# Patient Record
Sex: Male | Born: 1990 | Race: Black or African American | Hispanic: No | Smoking: Current every day smoker
Health system: Southern US, Community
[De-identification: ages and names within clinical notes are randomized; demographics above are authoritative.]

---
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
## 2017-02-23 ENCOUNTER — Ambulatory Visit (INDEPENDENT_AMBULATORY_CARE_PROVIDER_SITE_OTHER): Payer: Worker's Compensation

## 2017-02-23 ENCOUNTER — Encounter (HOSPITAL_COMMUNITY): Payer: Self-pay | Admitting: *Deleted

## 2017-02-23 ENCOUNTER — Ambulatory Visit (HOSPITAL_COMMUNITY)
Admission: EM | Admit: 2017-02-23 | Discharge: 2017-02-23 | Disposition: A | Discharge status: Home / Self Care | Payer: Worker's Compensation | Attending: Internal Medicine | Admitting: Internal Medicine

## 2017-02-23 DIAGNOSIS — M545 Low back pain, unspecified: Secondary | ICD-10-CM

## 2017-02-23 DIAGNOSIS — S1093XA Contusion of unspecified part of neck, initial encounter: Secondary | ICD-10-CM | POA: Diagnosis not present

## 2017-02-23 MED ORDER — NAPROXEN 500 MG PO TABS: 500.0000 mg | ORAL_TABLET | Freq: Two times a day (BID) | ORAL | 0 refills | Status: DC

## 2017-02-23 MED ORDER — KETOROLAC TROMETHAMINE 60 MG/2ML IM SOLN
60.0000 mg | Freq: Once | INTRAMUSCULAR | Status: AC
  Administered 2017-02-23: 60 mg via INTRAMUSCULAR

## 2017-02-23 MED ORDER — KETOROLAC TROMETHAMINE 60 MG/2ML IM SOLN
INTRAMUSCULAR | Status: AC
  Filled 2017-02-23: qty 2

## 2017-02-23 NOTE — ED Provider Notes (Signed)
[facility]    CSN: [id number] Arrival date & time: 02/23/17  1233     History   Chief Complaint Chief Complaint  Patient presents with  . Back Pain    HPI Grant Sandoval is a 26 y.o. male. 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  The shelf was full of boxes of packets of sugar, cups.  He was not knocked down. Did not hit his head, no loss of consciousness. Was able to get up unassisted. Walked into the urgent care independently. Discomfort in the left base of the neck/shoulder area, and in the right low back.    HPI  History reviewed. No pertinent past medical history.  History reviewed. No pertinent surgical history.     Home Medications    Prior to Admission medications   Medication Sig Start Date End Date Taking? Authorizing Provider  naproxen (NAPROSYN) 500 MG tablet Take 1 tablet (500 mg total) by mouth 2 (two) times daily. 02/23/17   Eustace Moore, MD    Family History History reviewed. No pertinent family history.  Social History Social History  Substance Use Topics  . Smoking status: Not on file  . Smokeless tobacco: Not on file  . Alcohol use No     Allergies   Patient has no known allergies.   Review of Systems Review of Systems  All other systems reviewed and are negative.    Physical Exam Triage Vital Signs ED Triage Vitals  Enc Vitals Group     BP 02/23/17 1346 122/70     Pulse Rate 02/23/17 1346 78     Resp 02/23/17 1346 18     Temp 02/23/17 1346 98.6 F (37 C)     Temp Source 02/23/17 1346 Oral     SpO2 02/23/17 1346 99 %     Weight --      Height --      Pain Score 02/23/17 1515 5     Pain Loc --    Updated Vital Signs BP 122/70 (BP Location: Right Arm)   Pulse 78   Temp 98.6 F (37 C) (Oral)   Resp 18   SpO2 99%   Physical Exam  Constitutional: He is oriented to person, place, and time. No distress.    Alert, nicely groomed  HENT:  Head: Atraumatic.  Eyes:  Conjugate gaze, no eye redness/drainage  Neck: Neck supple.  Cardiovascular: Normal rate.   Pulmonary/Chest: No respiratory distress.  Abdominal: He exhibits no distension.  Musculoskeletal: Normal range of motion.  Symmetric range of motion at both shoulders, able to extend fully overhead, internally and externally rotate. Little bit of discomfort with turning head, but range of motion is full including extension and flexion. No focal bony tenderness to percussion in the midline posteriorly. 3 inch puffy faintly red area at the left base of the neck laterally. Skin is intact. No midline posterior spinal percussion tenderness Mild right paralumbar spasm palpable Able to climb on/off exam table without difficulty, ambulate in facility  Neurological: He is alert and oriented to person, place, and time.  Skin: Skin is warm and dry.  No cyanosis  Nursing note and vitals reviewed.    UC Treatments / Results   Radiology Dg Cervical Spine Complete  Result Date: 02/23/2017 CLINICAL DATA:  Neck pain after shelf fell onto patient. EXAM: CERVICAL SPINE - COMPLETE 4+ VIEW COMPARISON:  None. FINDINGS: On the lateral view the cervical spine is visualized to the level of C7-T1. Straightening of the cervical spine. Pre-vertebral soft tissues are within normal limits. No fracture is detected in the cervical spine. Dens is well positioned between the lateral masses of C1. Cervical disc heights are preserved, with no appreciable spondylosis. No cervical spine subluxation. No significant facet arthropathy. No appreciable foraminal stenosis. No aggressive-appearing focal osseous lesions. There is soft tissue swelling in the posterior lower neck . IMPRESSION: 1. No cervical spine fracture or subluxation. 2. Straightening of the cervical spine, usually due to positioning and/or muscle spasm. 3. Soft tissue swelling in the posterior lower neck.  Electronically Signed   By: Delbert PhenixJason A Poff M.D.   On: 02/23/2017 14:48    Procedures Procedures (including critical care time)  Medications Ordered in UC Medications  ketorolac (TORADOL) injection 60 mg (60 mg Intramuscular Given 02/23/17 1441)   Neck Xrays were negative for bony injury/fracture at the urgent care today.  Injection of ketorolac (anti inflammatory/pain reliever) was given at the urgent care today.  Anticipate gradual improvement in pain and stiffness over the next several days.  Prescription for naproxen (anti inflammatory, pain reliever) [facility].  Ice for 5-10 minutes several times daily should help with pain and stiffness also.  Can return to duty but should avoid lifting >10 lbs for the next 3 days.  Followup with [facility] if not improving as expected.     Final Clinical Impressions(s) / UC Diagnoses   Final diagnoses:  Contusion of neck, initial encounter  Acute right-sided low back pain without sciatica    New Prescriptions Discharge Medication List as of 02/23/2017  3:10 PM    START taking these medications   Details  naproxen (NAPROSYN) 500 MG tablet Take 1 tablet (500 mg total) by mouth 2 (two) times daily., Starting Sun 02/23/2017, Normal         Eustace MooreMurray, Laura W, MD 02/26/17 2145

## 2017-02-23 NOTE — ED Triage Notes (Signed)
This   Is   A   workmans comp  Injury     He  Reports  A  Shelf  Fell  On  His  Upper  Back     Today  supv   Told     Pt  To  Come  To  The ucc      To  Be   Seen

## 2017-02-23 NOTE — Discharge Instructions (Addendum)
Neck Xrays were negative for bony injury/fracture at the urgent care today.  Injection of ketorolac (anti inflammatory/pain reliever) was given at the urgent care today.  Anticipate gradual improvement in pain and stiffness over the next several days.  Prescription for naproxen (anti inflammatory, pain reliever) [facility] [place].  Ice for 5-10 minutes several times daily should help with pain and stiffness also.  Can return to duty but should avoid lifting >10 lbs for the next 3 days.  Followup with [facility] if not improving as expected.

## 2017-03-28 ENCOUNTER — Ambulatory Visit (HOSPITAL_COMMUNITY)
Admission: EM | Admit: 2017-03-28 | Discharge: 2017-03-28 | Disposition: A | Discharge status: Home / Self Care | Payer: Self-pay | Attending: Physician Assistant | Admitting: Physician Assistant

## 2017-03-28 ENCOUNTER — Encounter (HOSPITAL_COMMUNITY): Payer: Self-pay | Admitting: *Deleted

## 2017-03-28 DIAGNOSIS — S39012A Strain of muscle, fascia and tendon of lower back, initial encounter: Secondary | ICD-10-CM

## 2017-03-28 DIAGNOSIS — G44319 Acute post-traumatic headache, not intractable: Secondary | ICD-10-CM

## 2017-03-28 MED ORDER — NAPROXEN 500 MG PO TABS: 500.0000 mg | ORAL_TABLET | Freq: Two times a day (BID) | ORAL | 0 refills | Status: DC

## 2017-03-28 MED ORDER — CYCLOBENZAPRINE HCL 10 MG PO TABS: 10.0000 mg | ORAL_TABLET | Freq: Two times a day (BID) | ORAL | 0 refills | Status: DC | PRN

## 2017-03-28 NOTE — ED Notes (Signed)
Bed: UC07 Expected date:  Expected time:  Means of arrival:  Comments: Closed 
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
## 2017-03-28 NOTE — Discharge Instructions (Signed)
Start Naproxen 500mg  twice a day for the next 7-10 days. It will help with the pain and inflammation of your muscles. I have prescribed you a muscle relaxant for muscle spasms/cramps. It will make you drowsy so do not take if you plan to operate heavy machinery or make big decisions.
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
## 2017-07-01 ENCOUNTER — Emergency Department (HOSPITAL_COMMUNITY)
Admission: EM | Admit: 2017-07-01 | Discharge: 2017-07-01 | Disposition: A | Discharge status: Home / Self Care | Payer: Self-pay | Attending: Emergency Medicine | Admitting: Emergency Medicine

## 2017-07-01 ENCOUNTER — Encounter (HOSPITAL_COMMUNITY): Payer: Self-pay | Admitting: *Deleted

## 2017-07-01 DIAGNOSIS — K0889 Other specified disorders of teeth and supporting structures: Secondary | ICD-10-CM | POA: Insufficient documentation

## 2017-07-01 DIAGNOSIS — Z79899 Other long term (current) drug therapy: Secondary | ICD-10-CM | POA: Insufficient documentation

## 2017-07-01 DIAGNOSIS — F17210 Nicotine dependence, cigarettes, uncomplicated: Secondary | ICD-10-CM | POA: Insufficient documentation

## 2017-07-01 MED ORDER — PENICILLIN V POTASSIUM 500 MG PO TABS: 500.0000 mg | ORAL_TABLET | Freq: Four times a day (QID) | ORAL | 0 refills | Status: AC

## 2017-07-01 MED ORDER — OXYCODONE-ACETAMINOPHEN 5-325 MG PO TABS
1.0000 | ORAL_TABLET | Freq: Once | ORAL | Status: AC
  Administered 2017-07-01: 1 via ORAL
  Filled 2017-07-01: qty 1

## 2017-07-01 MED ORDER — PENICILLIN V POTASSIUM 250 MG PO TABS
500.0000 mg | ORAL_TABLET | Freq: Once | ORAL | Status: AC
  Administered 2017-07-01: 500 mg via ORAL
  Filled 2017-07-01: qty 2

## 2017-07-01 MED ORDER — NAPROXEN 250 MG PO TABS
500.0000 mg | ORAL_TABLET | Freq: Once | ORAL | Status: AC
  Administered 2017-07-01: 500 mg via ORAL
  Filled 2017-07-01: qty 2

## 2017-07-01 MED ORDER — NAPROXEN 500 MG PO TABS: 500.0000 mg | ORAL_TABLET | Freq: Two times a day (BID) | ORAL | 0 refills | Status: DC

## 2017-07-01 NOTE — ED Notes (Signed)
[facility] pt, tooth pain, see provider's note

## 2017-07-01 NOTE — ED Provider Notes (Signed)
[facility]-EMERGENCY DEPT Provider Note   CSN: [id number] Arrival date & time: 07/01/17  0028    History   Chief Complaint Chief Complaint  Patient presents with  . Dental Pain    HPI Grant Sandoval is a 26 y.o. male.  26 year old male presents to the emergency department for constant, waxing and waning, throbbing pain to his right lower dentition which began this evening. No medications taken prior to arrival. Patient denies associated trauma or recent fevers. He denies being followed by a dentist.   The history is provided by the patient. No language interpreter was used.  Dental Pain   This is a new problem. The current episode started 3 to 5 hours ago. The problem occurs constantly. The problem has not changed since onset.The pain is mild. He has tried nothing for the symptoms. The treatment provided no relief.    History reviewed. No pertinent past medical history.  There are no active problems to display for this patient.   History reviewed. No pertinent surgical history.    Home Medications    Prior to Admission medications   Medication Sig Start Date End Date Taking? Authorizing Provider  cyclobenzaprine (FLEXERIL) 10 MG tablet Take 1 tablet (10 mg total) by mouth 2 (two) times daily as needed for muscle spasms. 03/28/17   Cathie Hoops, Amy V, PA-C  naproxen (NAPROSYN) 500 MG tablet Take 1 tablet (500 mg total) by mouth 2 (two) times daily. 07/01/17   Antony Madura, PA-C  penicillin v potassium (VEETID) 500 MG tablet Take 1 tablet (500 mg total) by mouth 4 (four) times daily. 07/01/17 07/08/17  Antony Madura, PA-C    Family History No family history on file.  Social History Social History  Substance Use Topics  . Smoking status: Current Some Day Smoker  . Smokeless tobacco: Never Used  . Alcohol use No     Allergies   Patient has no known allergies.   Review of Systems Review of Systems Ten systems reviewed and are negative for acute change, except as noted in the  HPI.    Physical Exam Updated Vital Signs BP 124/76 (BP Location: Right Arm)   Pulse 75   Temp 98 F (36.7 C) (Oral)   Resp 16   Ht  (1.727 m)   Wt 88.9 kg (196 lb)   SpO2 99%   BMI 29.80 kg/m   Physical Exam  Constitutional: He is oriented to person, place, and time. He appears well-developed and well-nourished. No distress.  Nontoxic appearing and in NAD  HENT:  Head: Normocephalic and atraumatic.  Mouth/Throat: Oropharynx is clear and moist. No trismus in the jaw. Dental caries present. No dental abscesses.    No trismus. Patient tolerating secretions without difficulty. Soft oral floor.  Eyes: Conjunctivae and EOM are normal. No scleral icterus.  Neck: Normal range of motion.  No meningismus  Pulmonary/Chest: Effort normal. No respiratory distress.  Respirations even and unlabored  Musculoskeletal: Normal range of motion.  Neurological: He is alert and oriented to person, place, and time. He exhibits normal muscle tone. Coordination normal.  Skin: Skin is warm and dry. No rash noted. He is not diaphoretic. No erythema. No pallor.  Psychiatric: He has a normal mood and affect. His behavior is normal.  Nursing note and vitals reviewed.    ED Treatments / Results  Labs (all labs ordered are listed, but only abnormal results are displayed) Labs Reviewed - No data to display  EKG  EKG Interpretation None  Radiology No results found.  Procedures Procedures (including critical care time)  Medications Ordered in ED Medications  oxyCODONE-acetaminophen (PERCOCET/ROXICET) 5-325 MG per tablet 1 tablet (1 tablet Oral Given 07/01/17 0258)  naproxen (NAPROSYN) tablet 500 mg (500 mg Oral Given 07/01/17 0258)  penicillin v potassium (VEETID) tablet 500 mg (500 mg Oral Given 07/01/17 0258)     Initial Impression / Assessment and Plan / ED Course  I have reviewed the triage vital signs and the nursing notes.  Pertinent labs & imaging results that were  available during my care of the patient were reviewed by me and considered in my medical decision making (see chart for details).     Patient with toothache. No gross abscess. Exam unconcerning for Ludwig's angina or spread of infection. Will treat with penicillin and pain medicine.  Urged patient to follow-up with dentist. Referral and resource guide provided. Patient discharged in stable condition with no unaddressed concerns.   Final Clinical Impressions(s) / ED Diagnoses   Final diagnoses:  Pain, dental    New Prescriptions New Prescriptions   PENICILLIN V POTASSIUM (VEETID) 500 MG TABLET    Take 1 tablet (500 mg total) by mouth 4 (four) times daily.     Antony Madura, PA-C 07/01/17 9604    Zadie Rhine, MD 07/01/17 [phone]

## 2017-07-01 NOTE — ED Triage Notes (Signed)
Pt c/o dental pain to R lower mouth for a month, pain woke him from sleep tonight

## 2018-03-23 ENCOUNTER — Emergency Department (HOSPITAL_COMMUNITY): Payer: Self-pay

## 2018-03-23 ENCOUNTER — Emergency Department (HOSPITAL_COMMUNITY)
Admission: EM | Admit: 2018-03-23 | Discharge: 2018-03-23 | Disposition: A | Discharge status: Home / Self Care | Payer: Self-pay | Attending: Emergency Medicine | Admitting: Emergency Medicine

## 2018-03-23 ENCOUNTER — Encounter (HOSPITAL_COMMUNITY): Payer: Self-pay | Admitting: Emergency Medicine

## 2018-03-23 ENCOUNTER — Other Ambulatory Visit: Payer: Self-pay

## 2018-03-23 DIAGNOSIS — F17200 Nicotine dependence, unspecified, uncomplicated: Secondary | ICD-10-CM | POA: Insufficient documentation

## 2018-03-23 DIAGNOSIS — M542 Cervicalgia: Secondary | ICD-10-CM | POA: Insufficient documentation

## 2018-03-23 DIAGNOSIS — R590 Localized enlarged lymph nodes: Secondary | ICD-10-CM | POA: Insufficient documentation

## 2018-03-23 DIAGNOSIS — J039 Acute tonsillitis, unspecified: Secondary | ICD-10-CM

## 2018-03-23 DIAGNOSIS — K029 Dental caries, unspecified: Secondary | ICD-10-CM

## 2018-03-23 LAB — CBC WITH DIFFERENTIAL/PLATELET
Abs Immature Granulocytes: 0 10*3/uL (ref 0.0–0.1)
BASOS PCT: 0 %
Basophils Absolute: 0 10*3/uL (ref 0.0–0.1)
Eosinophils Absolute: 0 10*3/uL (ref 0.0–0.7)
Eosinophils Relative: 0 %
HEMATOCRIT: 45 % (ref 39.0–52.0)
Hemoglobin: 15.6 g/dL (ref 13.0–17.0)
IMMATURE GRANULOCYTES: 0 %
LYMPHS ABS: 1.5 10*3/uL (ref 0.7–4.0)
Lymphocytes Relative: 20 %
MCH: 30.4 pg (ref 26.0–34.0)
MCHC: 34.7 g/dL (ref 30.0–36.0)
MCV: 87.7 fL (ref 78.0–100.0)
MONO ABS: 1.1 10*3/uL — AB (ref 0.1–1.0)
MONOS PCT: 14 %
NEUTROS PCT: 66 %
Neutro Abs: 5 10*3/uL (ref 1.7–7.7)
Platelets: 229 10*3/uL (ref 150–400)
RBC: 5.13 MIL/uL (ref 4.22–5.81)
RDW: 11.8 % (ref 11.5–15.5)
WBC: 7.7 10*3/uL (ref 4.0–10.5)

## 2018-03-23 LAB — URINALYSIS, ROUTINE W REFLEX MICROSCOPIC
BACTERIA UA: NONE SEEN
BILIRUBIN URINE: NEGATIVE
Glucose, UA: NEGATIVE mg/dL
Hgb urine dipstick: NEGATIVE
Ketones, ur: NEGATIVE mg/dL
LEUKOCYTES UA: NEGATIVE
Nitrite: NEGATIVE
PROTEIN: 30 mg/dL — AB
Specific Gravity, Urine: 1.035 — ABNORMAL HIGH (ref 1.005–1.030)
pH: 5 (ref 5.0–8.0)

## 2018-03-23 LAB — COMPREHENSIVE METABOLIC PANEL
ALBUMIN: 4.4 g/dL (ref 3.5–5.0)
ALT: 24 U/L (ref 17–63)
AST: 19 U/L (ref 15–41)
Alkaline Phosphatase: 50 U/L (ref 38–126)
Anion gap: 8 (ref 5–15)
BUN: 12 mg/dL (ref 6–20)
CALCIUM: 9.5 mg/dL (ref 8.9–10.3)
CHLORIDE: 104 mmol/L (ref 101–111)
CO2: 29 mmol/L (ref 22–32)
CREATININE: 1.19 mg/dL (ref 0.61–1.24)
GFR calc non Af Amer: >60 mL/min (ref >60)
GLUCOSE: 98 mg/dL (ref 65–99)
Potassium: 3.9 mmol/L (ref 3.5–5.1)
SODIUM: 141 mmol/L (ref 135–145)
Total Bilirubin: 1.7 mg/dL — ABNORMAL HIGH (ref 0.3–1.2)
Total Protein: 7.9 g/dL (ref 6.5–8.1)

## 2018-03-23 LAB — GROUP A STREP BY PCR: GROUP A STREP BY PCR: NOT DETECTED

## 2018-03-23 LAB — I-STAT CG4 LACTIC ACID, ED: Lactic Acid, Venous: 0.66 mmol/L (ref 0.5–1.9)

## 2018-03-23 MED ORDER — CLINDAMYCIN HCL 150 MG PO CAPS: 450.0000 mg | ORAL_CAPSULE | Freq: Three times a day (TID) | ORAL | 0 refills | Status: AC

## 2018-03-23 MED ORDER — CLINDAMYCIN PHOSPHATE 600 MG/50ML IV SOLN
600.0000 mg | Freq: Once | INTRAVENOUS | Status: AC
  Administered 2018-03-23: 600 mg via INTRAVENOUS
  Filled 2018-03-23: qty 50

## 2018-03-23 MED ORDER — ACETAMINOPHEN 325 MG PO TABS
650.0000 mg | ORAL_TABLET | Freq: Once | ORAL | Status: AC
  Administered 2018-03-23: 650 mg via ORAL
  Filled 2018-03-23: qty 2

## 2018-03-23 MED ORDER — DEXAMETHASONE SODIUM PHOSPHATE 10 MG/ML IJ SOLN
10.0000 mg | Freq: Once | INTRAMUSCULAR | Status: AC
  Administered 2018-03-23: 10 mg via INTRAVENOUS
  Filled 2018-03-23: qty 1

## 2018-03-23 MED ORDER — IOPAMIDOL (ISOVUE-300) INJECTION 61%
75.0000 mL | Freq: Once | INTRAVENOUS | Status: AC
  Administered 2018-03-23: 75 mL via INTRAVENOUS

## 2018-03-23 MED ORDER — SODIUM CHLORIDE 0.9 % IV BOLUS
1000.0000 mL | Freq: Once | INTRAVENOUS | Status: AC
  Administered 2018-03-23: 1000 mL via INTRAVENOUS

## 2018-03-23 NOTE — Discharge Instructions (Signed)
You were given a prescription for antibiotics. Please take the antibiotic prescription fully.   I have prescribed a new medication for you today. It is important that when you pick the prescription up you discuss the potential interactions of this medication with other medications you are taking, including over the counter medications, with the pharmacists.   This new medication has potential side effects. Be sure to contact your primary care provider or return to the emergency department if you are experiencing new symptoms that you are unable to tolerate after starting the medication. You need to receive medical evaluation immediately if you start to experience blistering of the skin, rash, swelling, or difficulty breathing as these signs could indicate a more serious medication side effect.   Please follow up with your primary care provider within 5-7 days for re-evaluation of your symptoms. If you do not have a primary care provider, information for a healthcare clinic has been provided for you to make arrangements for follow up care.  You were  also given resources to follow-up with a dentist. please return to the emergency department for any new or worsening symptoms.

## 2018-03-23 NOTE — ED Notes (Signed)
ED Provider at bedside. 

## 2018-03-23 NOTE — ED Triage Notes (Signed)
Pt. Stated, I hurt all over even my bones.

## 2018-03-23 NOTE — ED Notes (Signed)
Patient transported to CT 

## 2018-03-23 NOTE — ED Notes (Signed)
Pt stable, ambulatory, states understanding of discharge instructions 

## 2018-03-23 NOTE — ED Provider Notes (Signed)
[facility] EMERGENCY DEPARTMENT Provider Note   CSN: [id number] Arrival date & time: 03/23/18  0909     History   Chief Complaint Chief Complaint  Patient presents with  . Generalized Body Aches    HPI Grant Sandoval is a 27 y.o. male.  HPI   27 year old male with no stinging and past medical history who presents the emergency department today to be evaluated for sore throat which has been ongoing for the last 4 days.  States that 4 days ago began to have sore throat, chills, body aches.  Pain worse on the right side of his throat.  Has painful swallowing but has been able to tolerate liquids and solids at home.  No changes to his voice.  Pain is constant, worse with swallowing.  Rated as severe.  Did not take his temperature at home.  Denies headaches, lightheadedness, vision changes, cough, nasal congestion, rhinorrhea.  Does have some right-sided ear pain.  No chest pain or shortness of breath.  No abdominal pain, nausea, vomiting, diarrhea or urinary symptoms.  Denies being around anyone with similar symptoms.  Denies any dental pain.  History reviewed. No pertinent past medical history.  There are no active problems to display for this patient.   History reviewed. No pertinent surgical history.      Home Medications    Prior to Admission medications   Medication Sig Start Date End Date Taking? Authorizing Provider  clindamycin (CLEOCIN) 150 MG capsule Take 3 capsules (450 mg total) by mouth 3 (three) times daily for 10 days. 03/23/18 04/02/18  Deania Siguenza S, PA-C  cyclobenzaprine (FLEXERIL) 10 MG tablet Take 1 tablet (10 mg total) by mouth 2 (two) times daily as needed for muscle spasms. Patient not taking: Reported on 03/23/2018 03/28/17   Belinda Fisher, PA-C  naproxen (NAPROSYN) 500 MG tablet Take 1 tablet (500 mg total) by mouth 2 (two) times daily. Patient not taking: Reported on 03/23/2018 07/01/17   Antony Madura, PA-C    Family History No family  history on file.  Social History Social History   Tobacco Use  . Smoking status: Current Some Day Smoker  . Smokeless tobacco: Never Used  Substance Use Topics  . Alcohol use: No  . Drug use: No     Allergies   Shellfish allergy   Review of Systems Review of Systems  Constitutional: Positive for appetite change, chills and fatigue. Negative for fever.  HENT: Positive for congestion, ear pain and sore throat. Negative for dental problem, postnasal drip, rhinorrhea, trouble swallowing and voice change.   Eyes: Negative for pain and visual disturbance.  Respiratory: Negative for cough and shortness of breath.   Cardiovascular: Negative for chest pain.  Gastrointestinal: Negative for abdominal pain, constipation, diarrhea, nausea and vomiting.  Genitourinary: Negative for dysuria and hematuria.  Musculoskeletal: Negative for back pain.  Skin: Negative for rash.  Neurological: Negative for dizziness, weakness, light-headedness, numbness and headaches.  All other systems reviewed and are negative.  Physical Exam Updated Vital Signs BP 136/71 (BP Location: Right Arm)   Pulse 95   Temp 99.4 F (37.4 C) (Oral)   Resp 16   Ht 5\' 10"  (1.778 m)   Wt 85.7 kg (189 lb)   SpO2 98%   BMI 27.12 kg/m   Physical Exam  Constitutional: He is oriented to person, place, and time. He appears well-developed and well-nourished. No distress.  Nontoxic-appearing.  HENT:  Head: Normocephalic and atraumatic.  Right TM without effusion  or erythema.  Left TM serum and obstructed.  Pharyngeal erythema with edematous tonsils.  Right tonsil appears larger than left.  No tonsillar kissing.  Uvula not significantly deviated.  Patient tolerating secretions.  With patent airway.  No hot potato voice.  Eyes: Pupils are equal, round, and reactive to light. Conjunctivae and EOM are normal.  Neck: Normal range of motion. Neck supple.  Mild swelling to right side of neck along anterior cervical chain.    Cardiovascular: Normal rate, regular rhythm and normal heart sounds.  No murmur heard. Pulmonary/Chest: Effort normal and breath sounds normal. No stridor. No respiratory distress. He has no wheezes.  Abdominal: Soft. Bowel sounds are normal. He exhibits no distension. There is no tenderness. There is no guarding.  Musculoskeletal: Normal range of motion.  Lymphadenopathy:    He has cervical adenopathy.  Neurological: He is alert and oriented to person, place, and time. No cranial nerve deficit.  Skin: Skin is warm and dry.  Psychiatric: He has a normal mood and affect.  Nursing note and vitals reviewed.  ED Treatments / Results  Labs (all labs ordered are listed, but only abnormal results are displayed) Labs Reviewed  COMPREHENSIVE METABOLIC PANEL - Abnormal; Notable for the following components:      Result Value   Total Bilirubin 1.7 (*)    All other components within normal limits  CBC WITH DIFFERENTIAL/PLATELET - Abnormal; Notable for the following components:   Monocytes Absolute 1.1 (*)    All other components within normal limits  URINALYSIS, ROUTINE W REFLEX MICROSCOPIC - Abnormal; Notable for the following components:   Color, Urine AMBER (*)    Specific Gravity, Urine 1.035 (*)    Protein, ur 30 (*)    All other components within normal limits  GROUP A STREP BY PCR  I-STAT CG4 LACTIC ACID, ED    EKG None  Radiology Dg Chest 2 View  Result Date: 03/23/2018 CLINICAL DATA:  Fever since Friday ,,smoker EXAM: CHEST - 2 VIEW COMPARISON:  None. FINDINGS: The heart size and mediastinal contours are within normal limits. Both lungs are clear. The visualized skeletal structures are unremarkable. IMPRESSION: No active cardiopulmonary disease. No evidence of pneumonia or pulmonary edema. Electronically Signed   By: Bary RichardStan  Maynard M.D.   On: 03/23/2018 09:59   Ct Soft Tissue Neck W Contrast  Result Date: 03/23/2018 CLINICAL DATA:  27 year old male with fever and right side  neck pain for 3-4 days. Painful swallowing. EXAM: CT NECK WITH CONTRAST TECHNIQUE: Multidetector CT imaging of the neck was performed using the standard protocol following the bolus administration of intravenous contrast. CONTRAST:  75 milliliters ISOVUE-300 IOPAMIDOL (ISOVUE-300) INJECTION 61% COMPARISON:  Cervical spine radiographs 02/23/2017. FINDINGS: Pharynx and larynx: The larynx including the epiglottis appears normal. There is mildly asymmetric hypertrophy of hyperenhancing lingual tonsil (series 3, image 54), bulky or on the right. There is bilateral palatine tonsillar enlargement, again greater on the right. Hyperenhancement of the palatine tonsils is also noted and somewhat more conspicuous on the right side, however, there is no tonsillar or peritonsillar fluid collection identified. The parapharyngeal spaces appear symmetric and normal. Superimposed similar enlargement and mild hyperenhancement of the adenoids in the nasopharynx. The retropharyngeal space appears normal aside from reactive, mildly hyperenhancing retropharyngeal lymph nodes (series 3, image 33). No retropharyngeal fluid. Salivary glands: Sublingual space, sublingual glands, submandibular glands, and parotid glands appear normal. Thyroid: Negative. Lymph nodes: Enlarged and hyperenhancing bilateral level 2A lymph nodes, individually up to 19 millimeter short axis  on the right and 14 millimeters on the left. Smaller hyperenhancing level 3 lymph nodes, also more numerous on the right. The level 1 nodes appear spared. There is mild left level 5 lymph node prominence. The level 4 nodes appear normal. Vascular: Major vascular structures in the neck and at the skull base are patent including both internal jugular veins, although the IJ is are sometimes effaced by the reactive appearing lymph nodes. Limited intracranial: Negative. Visualized orbits: Negative. Mastoids and visualized paranasal sinuses: Clear. Hyperplastic (normal variant).  Skeleton: Carious right mandible molar (sagittal image 40). No other acute dental findings. No other osseous abnormality identified. Upper chest: Normal lung apices. Small volume residual thymus in the superior mediastinum, normal variant. No superior mediastinal lymphadenopathy. Visible axillary lymph nodes are normal. IMPRESSION: 1. Acute Tonsillitis with generalized enlarged and hyperenhancing palatine tonsils, adenoids, lingual tonsil. 2. Reactive retropharyngeal and bilateral level 2/3 lymphadenopathy. 3. Negative for abscess or complicating features. 4. Carious right mandible molar. Electronically Signed   By: Odessa Fleming M.D.   On: 03/23/2018 14:01    Procedures Procedures (including critical care time)  Medications Ordered in ED Medications  clindamycin (CLEOCIN) IVPB 600 mg (600 mg Intravenous New Bag/Given 03/23/18 1446)  sodium chloride 0.9 % bolus 1,000 mL (1,000 mLs Intravenous New Bag/Given 03/23/18 1446)  iopamidol (ISOVUE-300) 61 % injection 75 mL (75 mLs Intravenous Contrast Given 03/23/18 1348)  dexamethasone (DECADRON) injection 10 mg (10 mg Intravenous Given 03/23/18 1441)  acetaminophen (TYLENOL) tablet 650 mg (650 mg Oral Given 03/23/18 1440)     Initial Impression / Assessment and Plan / ED Course  I have reviewed the triage vital signs and the nursing notes.  Pertinent labs & imaging results that were available during my care of the patient were reviewed by me and considered in my medical decision making (see chart for details).  Discussed pt presentation and exam findings with Dr. Fredderick Phenix, who reccommended IV abx, decadron, rx for clinda as outpt and close output f/u.  Final Clinical Impressions(s) / ED Diagnoses   Final diagnoses:  Tonsillitis  Dental caries   27 year old male presenting with sore throat for 4 days with generalized body aches.  Temperature 100 Fahrenheit initially.  Borderline tachycardic, with normal blood pressure respirations and O2 sats.  All vital  signs normalized by the time of discharge.  Patient has pharyngeal erythema and tonsillar edema bilaterally that is worse on the right.  No significant uvular deviation.  He has a patent airway with a normal voice.  He is tolerating p.o. in the ED without difficulty.  His labs are without leukocytosis.  CMP is within normal limits without gross electrolyte abnormalities.  Group A strep was negative.  Lactic acid was negative.  UA negative.  Patient is nontoxic-appearing nonseptic appearing.  Chest x-ray negative for pneumonia or other abnormality.  CT soft tissue neck was completed and did not show any signs of peritonsillar retropharyngeal abscess.  Did show acute tonsillitis with generalized and large tonsils with retropharyngeal lymphadenopathy.  Dental caries also noted.  Hydrated with IV fluids in the ED as well as given IV Clinda and Decadron.  Will be DC'd with Rx for Clinda and advised to follow-up closely as an outpatient.  Strict return precautions were discussed for any concerning symptoms.  Patient also given dental resources given his dental caries on CT scan.  Advised to follow-up with dentist.  Patient voices understanding of the plan and reasons to return immediately to the ED.  All questions were  answered.  ED Discharge Orders        Ordered    clindamycin (CLEOCIN) 150 MG capsule  3 times daily     03/23/18 [address], Ellesse Antenucci S, PA-C 03/23/18 1513    Rolan Bucco, MD 03/23/18 1516

## 2018-12-26 ENCOUNTER — Emergency Department (HOSPITAL_COMMUNITY)
Admission: EM | Admit: 2018-12-26 | Discharge: 2018-12-26 | Disposition: A | Discharge status: Home / Self Care | Payer: Managed Care, Other (non HMO) | Attending: Emergency Medicine | Admitting: Emergency Medicine

## 2018-12-26 ENCOUNTER — Other Ambulatory Visit: Payer: Self-pay

## 2018-12-26 ENCOUNTER — Emergency Department (HOSPITAL_COMMUNITY): Payer: Managed Care, Other (non HMO)

## 2018-12-26 ENCOUNTER — Encounter (HOSPITAL_COMMUNITY): Payer: Self-pay | Admitting: Emergency Medicine

## 2018-12-26 DIAGNOSIS — S61411A Laceration without foreign body of right hand, initial encounter: Secondary | ICD-10-CM | POA: Diagnosis not present

## 2018-12-26 DIAGNOSIS — S3991XA Unspecified injury of abdomen, initial encounter: Secondary | ICD-10-CM | POA: Diagnosis present

## 2018-12-26 DIAGNOSIS — S0990XA Unspecified injury of head, initial encounter: Secondary | ICD-10-CM | POA: Insufficient documentation

## 2018-12-26 DIAGNOSIS — S52092A Other fracture of upper end of left ulna, initial encounter for closed fracture: Secondary | ICD-10-CM | POA: Diagnosis not present

## 2018-12-26 DIAGNOSIS — S52182A Other fracture of upper end of left radius, initial encounter for closed fracture: Secondary | ICD-10-CM | POA: Diagnosis not present

## 2018-12-26 DIAGNOSIS — W3400XA Accidental discharge from unspecified firearms or gun, initial encounter: Secondary | ICD-10-CM

## 2018-12-26 DIAGNOSIS — Y999 Unspecified external cause status: Secondary | ICD-10-CM | POA: Diagnosis not present

## 2018-12-26 DIAGNOSIS — S42402A Unspecified fracture of lower end of left humerus, initial encounter for closed fracture: Secondary | ICD-10-CM

## 2018-12-26 DIAGNOSIS — S298XXA Other specified injuries of thorax, initial encounter: Secondary | ICD-10-CM | POA: Insufficient documentation

## 2018-12-26 DIAGNOSIS — Y9389 Activity, other specified: Secondary | ICD-10-CM | POA: Diagnosis not present

## 2018-12-26 DIAGNOSIS — Y92008 Other place in unspecified non-institutional (private) residence as the place of occurrence of the external cause: Secondary | ICD-10-CM | POA: Insufficient documentation

## 2018-12-26 LAB — COMPREHENSIVE METABOLIC PANEL
ALT: 22 U/L (ref 0–44)
AST: 27 U/L (ref 15–41)
Albumin: 4.6 g/dL (ref 3.5–5.0)
Alkaline Phosphatase: 51 U/L (ref 38–126)
Anion gap: 16 — ABNORMAL HIGH (ref 5–15)
BUN: 12 mg/dL (ref 6–20)
CO2: 18 mmol/L — ABNORMAL LOW (ref 22–32)
Calcium: 9.6 mg/dL (ref 8.9–10.3)
Chloride: 106 mmol/L (ref 98–111)
Creatinine, Ser: 1.41 mg/dL — ABNORMAL HIGH (ref 0.61–1.24)
GFR calc non Af Amer: >60 mL/min (ref >60)
Glucose, Bld: 173 mg/dL — ABNORMAL HIGH (ref 70–99)
Potassium: 2.7 mmol/L — CL (ref 3.5–5.1)
Sodium: 140 mmol/L (ref 135–145)
TOTAL PROTEIN: 7.6 g/dL (ref 6.5–8.1)
Total Bilirubin: 2 mg/dL — ABNORMAL HIGH (ref 0.3–1.2)

## 2018-12-26 LAB — URINALYSIS, ROUTINE W REFLEX MICROSCOPIC
Bacteria, UA: NONE SEEN
Bilirubin Urine: NEGATIVE
Glucose, UA: NEGATIVE mg/dL
Ketones, ur: NEGATIVE mg/dL
Leukocytes,Ua: NEGATIVE
NITRITE: NEGATIVE
Protein, ur: NEGATIVE mg/dL
Specific Gravity, Urine: 1.043 — ABNORMAL HIGH (ref 1.005–1.030)
pH: 6 (ref 5.0–8.0)

## 2018-12-26 LAB — CBC
HCT: 47 % (ref 39.0–52.0)
HEMOGLOBIN: 15.8 g/dL (ref 13.0–17.0)
MCH: 29.9 pg (ref 26.0–34.0)
MCHC: 33.6 g/dL (ref 30.0–36.0)
MCV: 88.8 fL (ref 80.0–100.0)
NRBC: 0 % (ref 0.0–0.2)
Platelets: 331 10*3/uL (ref 150–400)
RBC: 5.29 MIL/uL (ref 4.22–5.81)
RDW: 11.9 % (ref 11.5–15.5)
WBC: 11 10*3/uL — ABNORMAL HIGH (ref 4.0–10.5)

## 2018-12-26 LAB — PROTIME-INR
INR: 1.1 (ref 0.8–1.2)
Prothrombin Time: 13.7 seconds (ref 11.4–15.2)

## 2018-12-26 LAB — BPAM RBC
Blood Product Expiration Date: 202004172359
Blood Product Expiration Date: 202004172359
ISSUE DATE / TIME: 202003141235
ISSUE DATE / TIME: 202003141235
Unit Type and Rh: 5100
Unit Type and Rh: 5100

## 2018-12-26 LAB — TYPE AND SCREEN
ABO/RH(D): B POS
Antibody Screen: NEGATIVE
Unit division: 0
Unit division: 0

## 2018-12-26 LAB — LACTIC ACID, PLASMA: Lactic Acid, Venous: 7.2 mmol/L (ref 0.5–1.9)

## 2018-12-26 LAB — ETHANOL: Alcohol, Ethyl (B): <10 mg/dL (ref <10)

## 2018-12-26 LAB — CDS SEROLOGY

## 2018-12-26 LAB — ABO/RH: ABO/RH(D): B POS

## 2018-12-26 MED ORDER — LIDOCAINE-EPINEPHRINE 2 %-1:100000 IJ SOLN
20.0000 mL | Freq: Once | INTRAMUSCULAR | Status: AC
  Administered 2018-12-26: 20 mL
  Filled 2018-12-26: qty 20

## 2018-12-26 MED ORDER — MORPHINE SULFATE 15 MG PO TABS: 15.0000 mg | ORAL_TABLET | ORAL | 0 refills | Status: DC | PRN

## 2018-12-26 MED ORDER — IOHEXOL 300 MG/ML  SOLN
100.0000 mL | Freq: Once | INTRAMUSCULAR | Status: AC | PRN
  Administered 2018-12-26: 100 mL via INTRAVENOUS

## 2018-12-26 MED ORDER — MORPHINE SULFATE (PF) 4 MG/ML IV SOLN
INTRAVENOUS | Status: AC
  Filled 2018-12-26: qty 1

## 2018-12-26 MED ORDER — MORPHINE SULFATE 2 MG/ML IJ SOLN
INTRAMUSCULAR | Status: AC | PRN
  Administered 2018-12-26: 4 mg via INTRAVENOUS

## 2018-12-26 MED ORDER — MORPHINE SULFATE (PF) 4 MG/ML IV SOLN
4.0000 mg | Freq: Once | INTRAVENOUS | Status: AC
  Administered 2018-12-26: 4 mg via INTRAVENOUS
  Filled 2018-12-26: qty 1

## 2018-12-26 MED ORDER — CEPHALEXIN 500 MG PO CAPS: 500.0000 mg | ORAL_CAPSULE | Freq: Four times a day (QID) | ORAL | 0 refills | Status: DC

## 2018-12-26 MED ORDER — CEFAZOLIN SODIUM-DEXTROSE 2-4 GM/100ML-% IV SOLN
2.0000 g | Freq: Once | INTRAVENOUS | Status: AC
  Administered 2018-12-26: 2 g via INTRAVENOUS
  Filled 2018-12-26: qty 100

## 2018-12-26 NOTE — ED Notes (Signed)
Dr Ranae Palms notified of pt's pain level 10/10, gave verbal order for 4mg  morphine IV
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
## 2018-12-26 NOTE — Progress Notes (Signed)
   12/26/18 1600  Clinical Encounter Type  Visited With Patient;Health care provider  Visit Type ED  Provided the ministry of presence 

## 2018-12-26 NOTE — Discharge Instructions (Addendum)
Return to [facility] at 8 AM on Monday morning to have surgery for your left elbow.  You should be able to check in at admissions like you are getting ready to go straight to an operation.  The surgeon should be Dr. Bradly Bienenstock.  He should not have anything to eat or drink after midnight the night before surgery.
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
## 2018-12-26 NOTE — ED Notes (Signed)
Pt and family verbalized understanding of d/c instructions. Provided urinal, extra 4x4 and tape.  Pt assisted to 6N elevators to go to floor to visit his mother. Pt verbalized understanding to be at admitting Monday morning for surgery.

## 2018-12-26 NOTE — Progress Notes (Signed)
Orthopedic Tech Progress Note Patient Details:  Grant Sandoval 29-Oct-1990 [id number]          [name] 12/26/2018, 3:54 PM
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
## 2018-12-27 ENCOUNTER — Telehealth: Payer: Self-pay | Admitting: Surgery

## 2018-12-27 LAB — BPAM FFP
BLOOD PRODUCT EXPIRATION DATE: 202003192359
ISSUE DATE / TIME: 202003141241
UNIT TYPE AND RH: 6200

## 2018-12-27 LAB — PREPARE FRESH FROZEN PLASMA: Unit division: 0

## 2018-12-27 NOTE — Telephone Encounter (Signed)
ED CM received call from patient concerning a, w/c for home use. Patient does not have insurance unable assist provided patient with some resources to obtain w/c in the community.

## 2018-12-28 ENCOUNTER — Other Ambulatory Visit: Payer: Self-pay

## 2018-12-28 ENCOUNTER — Inpatient Hospital Stay (HOSPITAL_COMMUNITY): Payer: Managed Care, Other (non HMO) | Admitting: Anesthesiology

## 2018-12-28 ENCOUNTER — Ambulatory Visit (HOSPITAL_COMMUNITY)
Admission: RE | Admit: 2018-12-28 | Discharge: 2018-12-29 | Disposition: A | Discharge status: Home / Self Care | Payer: Managed Care, Other (non HMO) | Source: Ambulatory Visit | Attending: Orthopedic Surgery | Admitting: Orthopedic Surgery

## 2018-12-28 ENCOUNTER — Encounter (HOSPITAL_COMMUNITY)
Admission: RE | Disposition: A | Discharge status: Home / Self Care | Payer: Self-pay | Source: Ambulatory Visit | Attending: Orthopedic Surgery

## 2018-12-28 ENCOUNTER — Encounter (HOSPITAL_COMMUNITY): Payer: Self-pay

## 2018-12-28 DIAGNOSIS — S42402B Unspecified fracture of lower end of left humerus, initial encounter for open fracture: Secondary | ICD-10-CM | POA: Insufficient documentation

## 2018-12-28 DIAGNOSIS — S52202A Unspecified fracture of shaft of left ulna, initial encounter for closed fracture: Secondary | ICD-10-CM | POA: Diagnosis present

## 2018-12-28 DIAGNOSIS — W3400XA Accidental discharge from unspecified firearms or gun, initial encounter: Secondary | ICD-10-CM | POA: Insufficient documentation

## 2018-12-28 DIAGNOSIS — S52042B Displaced fracture of coronoid process of left ulna, initial encounter for open fracture type I or II: Secondary | ICD-10-CM | POA: Diagnosis not present

## 2018-12-28 DIAGNOSIS — S91142A Puncture wound with foreign body of left great toe without damage to nail, initial encounter: Secondary | ICD-10-CM | POA: Insufficient documentation

## 2018-12-28 DIAGNOSIS — S52122B Displaced fracture of head of left radius, initial encounter for open fracture type I or II: Secondary | ICD-10-CM | POA: Diagnosis not present

## 2018-12-28 HISTORY — PX: ORIF ELBOW FRACTURE: SHX5031

## 2018-12-28 HISTORY — PX: FOREIGN BODY REMOVAL: SHX962

## 2018-12-28 LAB — BASIC METABOLIC PANEL
Anion gap: 8 (ref 5–15)
BUN: 9 mg/dL (ref 6–20)
CO2: 23 mmol/L (ref 22–32)
Calcium: 9.1 mg/dL (ref 8.9–10.3)
Chloride: 104 mmol/L (ref 98–111)
Creatinine, Ser: 0.99 mg/dL (ref 0.61–1.24)
GFR calc non Af Amer: >60 mL/min (ref >60)
Glucose, Bld: 123 mg/dL — ABNORMAL HIGH (ref 70–99)
Potassium: 3.7 mmol/L (ref 3.5–5.1)
Sodium: 135 mmol/L (ref 135–145)

## 2018-12-28 SURGERY — OPEN REDUCTION INTERNAL FIXATION (ORIF) ELBOW/OLECRANON FRACTURE
Anesthesia: Regional | Site: Toe | Laterality: Left

## 2018-12-28 MED ORDER — ADULT MULTIVITAMIN W/MINERALS CH
1.0000 | ORAL_TABLET | Freq: Every day | ORAL | Status: DC
  Administered 2018-12-29: 1 via ORAL
  Filled 2018-12-28: qty 1

## 2018-12-28 MED ORDER — ESMOLOL HCL 100 MG/10ML IV SOLN
INTRAVENOUS | Status: AC
  Filled 2018-12-28: qty 10

## 2018-12-28 MED ORDER — DEXMEDETOMIDINE HCL 200 MCG/2ML IV SOLN
INTRAVENOUS | Status: DC | PRN
  Administered 2018-12-28: 12 ug via INTRAVENOUS
  Administered 2018-12-28 (×2): 8 ug via INTRAVENOUS
  Administered 2018-12-28: 12 ug via INTRAVENOUS

## 2018-12-28 MED ORDER — FENTANYL CITRATE (PF) 100 MCG/2ML IJ SOLN
25.0000 ug | INTRAMUSCULAR | Status: DC | PRN
  Administered 2018-12-28: 50 ug via INTRAVENOUS
  Administered 2018-12-28: 25 ug via INTRAVENOUS

## 2018-12-28 MED ORDER — FENTANYL CITRATE (PF) 100 MCG/2ML IJ SOLN
100.0000 ug | Freq: Once | INTRAMUSCULAR | Status: AC
  Administered 2018-12-28: 100 ug via INTRAVENOUS
  Filled 2018-12-28: qty 2

## 2018-12-28 MED ORDER — METHOCARBAMOL 500 MG PO TABS
500.0000 mg | ORAL_TABLET | Freq: Four times a day (QID) | ORAL | Status: DC | PRN
  Administered 2018-12-28 – 2018-12-29 (×2): 500 mg via ORAL
  Filled 2018-12-28 (×2): qty 1

## 2018-12-28 MED ORDER — METHOCARBAMOL 1000 MG/10ML IJ SOLN
500.0000 mg | Freq: Four times a day (QID) | INTRAVENOUS | Status: DC | PRN
  Filled 2018-12-28: qty 5

## 2018-12-28 MED ORDER — FENTANYL CITRATE (PF) 100 MCG/2ML IJ SOLN
INTRAMUSCULAR | Status: AC
  Filled 2018-12-28: qty 2

## 2018-12-28 MED ORDER — CHLORHEXIDINE GLUCONATE 4 % EX LIQD: 60.0000 mL | Freq: Once | CUTANEOUS | Status: DC

## 2018-12-28 MED ORDER — CEFAZOLIN SODIUM-DEXTROSE 1-4 GM/50ML-% IV SOLN
1.0000 g | Freq: Three times a day (TID) | INTRAVENOUS | Status: DC
  Administered 2018-12-29: 1 g via INTRAVENOUS
  Filled 2018-12-28 (×2): qty 50

## 2018-12-28 MED ORDER — SENNOSIDES-DOCUSATE SODIUM 8.6-50 MG PO TABS: 1.0000 | ORAL_TABLET | Freq: Every evening | ORAL | Status: DC | PRN

## 2018-12-28 MED ORDER — HYDROMORPHONE HCL 1 MG/ML IJ SOLN
0.5000 mg | INTRAMUSCULAR | Status: DC | PRN
  Administered 2018-12-29: 1 mg via INTRAVENOUS
  Filled 2018-12-28: qty 1

## 2018-12-28 MED ORDER — LACTATED RINGERS IV SOLN
INTRAVENOUS | Status: DC
  Administered 2018-12-28 (×2): via INTRAVENOUS

## 2018-12-28 MED ORDER — DEXAMETHASONE SODIUM PHOSPHATE 10 MG/ML IJ SOLN
INTRAMUSCULAR | Status: AC
  Filled 2018-12-28: qty 1

## 2018-12-28 MED ORDER — SODIUM CHLORIDE 0.9 % IV SOLN
INTRAVENOUS | Status: DC | PRN
  Administered 2018-12-28: 10 ug/min via INTRAVENOUS

## 2018-12-28 MED ORDER — MIDAZOLAM HCL 2 MG/2ML IJ SOLN
INTRAMUSCULAR | Status: AC
  Administered 2018-12-28: 2 mg via INTRAVENOUS
  Filled 2018-12-28: qty 2

## 2018-12-28 MED ORDER — HYDROCODONE-ACETAMINOPHEN 10-325 MG PO TABS
1.0000 | ORAL_TABLET | ORAL | Status: DC | PRN
  Administered 2018-12-29 (×3): 2 via ORAL
  Filled 2018-12-28 (×3): qty 2

## 2018-12-28 MED ORDER — FENTANYL CITRATE (PF) 250 MCG/5ML IJ SOLN
INTRAMUSCULAR | Status: AC
  Filled 2018-12-28: qty 5

## 2018-12-28 MED ORDER — OXYCODONE HCL 5 MG PO TABS
5.0000 mg | ORAL_TABLET | ORAL | Status: DC | PRN
  Administered 2018-12-28: 10 mg via ORAL
  Filled 2018-12-28: qty 2

## 2018-12-28 MED ORDER — FENTANYL CITRATE (PF) 100 MCG/2ML IJ SOLN
INTRAMUSCULAR | Status: DC | PRN
  Administered 2018-12-28 (×2): 50 ug via INTRAVENOUS
  Administered 2018-12-28: 100 ug via INTRAVENOUS
  Administered 2018-12-28 (×2): 50 ug via INTRAVENOUS

## 2018-12-28 MED ORDER — BUPIVACAINE-EPINEPHRINE (PF) 0.5% -1:200000 IJ SOLN
INTRAMUSCULAR | Status: DC | PRN
  Administered 2018-12-28: 30 mL via PERINEURAL

## 2018-12-28 MED ORDER — ONDANSETRON HCL 4 MG/2ML IJ SOLN
INTRAMUSCULAR | Status: AC
  Filled 2018-12-28: qty 2

## 2018-12-28 MED ORDER — BUPIVACAINE HCL (PF) 0.25 % IJ SOLN
INTRAMUSCULAR | Status: AC
  Filled 2018-12-28: qty 30

## 2018-12-28 MED ORDER — PROMETHAZINE HCL 25 MG/ML IJ SOLN: 6.2500 mg | INTRAMUSCULAR | Status: DC | PRN

## 2018-12-28 MED ORDER — ACETAMINOPHEN 500 MG PO TABS
1000.0000 mg | ORAL_TABLET | Freq: Once | ORAL | Status: AC
  Administered 2018-12-28: 1000 mg via ORAL
  Filled 2018-12-28: qty 2

## 2018-12-28 MED ORDER — 0.9 % SODIUM CHLORIDE (POUR BTL) OPTIME
TOPICAL | Status: DC | PRN
  Administered 2018-12-28: 1000 mL

## 2018-12-28 MED ORDER — DEXAMETHASONE SODIUM PHOSPHATE 10 MG/ML IJ SOLN
INTRAMUSCULAR | Status: DC | PRN
  Administered 2018-12-28: 5 mg via INTRAVENOUS

## 2018-12-28 MED ORDER — VITAMIN C 500 MG PO TABS
1000.0000 mg | ORAL_TABLET | Freq: Every day | ORAL | Status: DC
  Administered 2018-12-29: 1000 mg via ORAL
  Filled 2018-12-28: qty 2

## 2018-12-28 MED ORDER — DOCUSATE SODIUM 100 MG PO CAPS
100.0000 mg | ORAL_CAPSULE | Freq: Two times a day (BID) | ORAL | Status: DC
  Administered 2018-12-28 – 2018-12-29 (×2): 100 mg via ORAL
  Filled 2018-12-28 (×2): qty 1

## 2018-12-28 MED ORDER — PROPOFOL 10 MG/ML IV BOLUS
INTRAVENOUS | Status: DC | PRN
  Administered 2018-12-28: 200 mg via INTRAVENOUS

## 2018-12-28 MED ORDER — ONDANSETRON HCL 4 MG PO TABS: 4.0000 mg | ORAL_TABLET | Freq: Four times a day (QID) | ORAL | Status: DC | PRN

## 2018-12-28 MED ORDER — MIDAZOLAM HCL 2 MG/2ML IJ SOLN
2.0000 mg | Freq: Once | INTRAMUSCULAR | Status: AC
  Administered 2018-12-28: 2 mg via INTRAVENOUS
  Filled 2018-12-28: qty 2

## 2018-12-28 MED ORDER — DIPHENHYDRAMINE HCL 25 MG PO CAPS: 25.0000 mg | ORAL_CAPSULE | Freq: Four times a day (QID) | ORAL | Status: DC | PRN

## 2018-12-28 MED ORDER — ONDANSETRON HCL 4 MG/2ML IJ SOLN: 4.0000 mg | Freq: Four times a day (QID) | INTRAMUSCULAR | Status: DC | PRN

## 2018-12-28 MED ORDER — CEFAZOLIN SODIUM-DEXTROSE 1-4 GM/50ML-% IV SOLN
1.0000 g | INTRAVENOUS | Status: DC
  Filled 2018-12-28: qty 50

## 2018-12-28 MED ORDER — LIDOCAINE 2% (20 MG/ML) 5 ML SYRINGE
INTRAMUSCULAR | Status: DC | PRN
  Administered 2018-12-28: 100 mg via INTRAVENOUS

## 2018-12-28 MED ORDER — CEFAZOLIN SODIUM-DEXTROSE 2-4 GM/100ML-% IV SOLN
2.0000 g | INTRAVENOUS | Status: AC
  Administered 2018-12-28: 2 g via INTRAVENOUS
  Filled 2018-12-28: qty 100

## 2018-12-28 MED ORDER — ESMOLOL HCL 100 MG/10ML IV SOLN
INTRAVENOUS | Status: DC | PRN
  Administered 2018-12-28: 30 mg via INTRAVENOUS
  Administered 2018-12-28: 40 mg via INTRAVENOUS

## 2018-12-28 MED ORDER — FENTANYL CITRATE (PF) 100 MCG/2ML IJ SOLN
INTRAMUSCULAR | Status: AC
  Administered 2018-12-28: 100 ug via INTRAVENOUS
  Filled 2018-12-28: qty 2

## 2018-12-28 MED ORDER — ONDANSETRON HCL 4 MG/2ML IJ SOLN
INTRAMUSCULAR | Status: DC | PRN
  Administered 2018-12-28: 4 mg via INTRAVENOUS

## 2018-12-28 MED ORDER — BUPIVACAINE HCL (PF) 0.25 % IJ SOLN: INTRAMUSCULAR | Status: DC | PRN

## 2018-12-28 SURGICAL SUPPLY — 85 items
BANDAGE ACE 3X5.8 VEL STRL LF (GAUZE/BANDAGES/DRESSINGS) ×2 IMPLANT
BANDAGE ACE 4X5 VEL STRL LF (GAUZE/BANDAGES/DRESSINGS) ×2 IMPLANT
BIT DRILL 2.5X2.75 QC CALB (BIT) ×2 IMPLANT
BIT DRILL CALIBRATED 2.7 (BIT) ×1 IMPLANT
BIT DRILL CALIBRATED 2.7MM (BIT) ×1
BNDG CMPR 9X4 STRL LF SNTH (GAUZE/BANDAGES/DRESSINGS) ×2
BNDG COHESIVE 2X5 TAN STRL LF (GAUZE/BANDAGES/DRESSINGS) ×2 IMPLANT
BNDG COHESIVE 4X5 TAN STRL (GAUZE/BANDAGES/DRESSINGS) ×4 IMPLANT
BNDG CONFORM 3 STRL LF (GAUZE/BANDAGES/DRESSINGS) ×2 IMPLANT
BNDG ESMARK 4X9 LF (GAUZE/BANDAGES/DRESSINGS) ×4 IMPLANT
BNDG GAUZE ELAST 4 BULKY (GAUZE/BANDAGES/DRESSINGS) ×2 IMPLANT
CONNECTOR 5 IN 1 STRAIGHT STRL (MISCELLANEOUS) ×2 IMPLANT
CONT SPEC 4OZ CLIKSEAL STRL BL (MISCELLANEOUS) ×4 IMPLANT
CORDS BIPOLAR (ELECTRODE) ×4 IMPLANT
COVER MAYO STAND STRL (DRAPES) ×4 IMPLANT
COVER SURGICAL LIGHT HANDLE (MISCELLANEOUS) ×4 IMPLANT
COVER WAND RF STERILE (DRAPES) ×4 IMPLANT
CUFF TOURNIQUET SINGLE 18IN (TOURNIQUET CUFF) ×6 IMPLANT
CUFF TOURNIQUET SINGLE 24IN (TOURNIQUET CUFF) IMPLANT
DRAPE INCISE IOBAN 66X45 STRL (DRAPES) ×4 IMPLANT
DRAPE OEC MINIVIEW 54X84 (DRAPES) ×2 IMPLANT
DRAPE ORTHO SPLIT 77X108 STRL (DRAPES) ×8
DRAPE SURG ORHT 6 SPLT 77X108 (DRAPES) ×4 IMPLANT
DRAPE U-SHAPE 47X51 STRL (DRAPES) ×6 IMPLANT
DRSG ADAPTIC 3X8 NADH LF (GAUZE/BANDAGES/DRESSINGS) IMPLANT
DRSG XEROFORM 1X8 (GAUZE/BANDAGES/DRESSINGS) ×2 IMPLANT
GAUZE SPONGE 4X4 12PLY STRL (GAUZE/BANDAGES/DRESSINGS) ×2 IMPLANT
GAUZE SPONGE 4X4 12PLY STRL LF (GAUZE/BANDAGES/DRESSINGS) ×2 IMPLANT
GAUZE XEROFORM 5X9 LF (GAUZE/BANDAGES/DRESSINGS) ×4 IMPLANT
GLOVE BIOGEL PI IND STRL 8.5 (GLOVE) ×2 IMPLANT
GLOVE BIOGEL PI INDICATOR 8.5 (GLOVE) ×2
GLOVE ECLIPSE 6.5 STRL STRAW (GLOVE) ×2 IMPLANT
GLOVE SURG ORTHO 8.0 STRL STRW (GLOVE) ×6 IMPLANT
GOWN STRL REUS W/ TWL LRG LVL3 (GOWN DISPOSABLE) ×4 IMPLANT
GOWN STRL REUS W/ TWL XL LVL3 (GOWN DISPOSABLE) ×2 IMPLANT
GOWN STRL REUS W/TWL LRG LVL3 (GOWN DISPOSABLE) ×8
GOWN STRL REUS W/TWL XL LVL3 (GOWN DISPOSABLE) ×4
HEAD EXPLOR 10X24MM (Head) ×2 IMPLANT
K-WIRE 2X5 SS THRDED S3 (WIRE) ×12
KIT BASIN OR (CUSTOM PROCEDURE TRAY) ×4 IMPLANT
KIT TURNOVER KIT B (KITS) ×4 IMPLANT
KWIRE 2X5 SS THRDED S3 (WIRE) IMPLANT
LOOP VESSEL MAXI BLUE (MISCELLANEOUS) IMPLANT
MANIFOLD NEPTUNE II (INSTRUMENTS) ×4 IMPLANT
NDL HYPO 25GX1X1/2 BEV (NEEDLE) IMPLANT
NEEDLE HYPO 25GX1X1/2 BEV (NEEDLE) ×4 IMPLANT
NS IRRIG 1000ML POUR BTL (IV SOLUTION) ×4 IMPLANT
PACK ORTHO EXTREMITY (CUSTOM PROCEDURE TRAY) ×4 IMPLANT
PAD ARMBOARD 7.5X6 YLW CONV (MISCELLANEOUS) ×8 IMPLANT
PAD CAST 4YDX4 CTTN HI CHSV (CAST SUPPLIES) IMPLANT
PADDING CAST COTTON 4X4 STRL (CAST SUPPLIES) ×4
PLATE LONG OLECRANON LEFT (Plate) ×2 IMPLANT
SCREW CORTICAL LOW PROF 3.5X20 (Screw) ×2 IMPLANT
SCREW LOCK CORT STAR 3.5X12 (Screw) ×4 IMPLANT
SCREW LOCK CORT STAR 3.5X18 (Screw) ×4 IMPLANT
SCREW LOCK CORT STAR 3.5X20 (Screw) ×2 IMPLANT
SCREW LOCK CORT STAR 3.5X22 (Screw) ×2 IMPLANT
SCREW LOW PROFILE 22MMX3.5MM (Screw) ×4 IMPLANT
SCREW LP 3.5X60MM (Screw) ×2 IMPLANT
SCREW NON LOCKING LP 3.5 14MM (Screw) ×2 IMPLANT
SLING ARM FOAM STRAP XLG (SOFTGOODS) ×2 IMPLANT
SOAP 2 % CHG 4 OZ (WOUND CARE) ×4 IMPLANT
SPLINT FIBERGLASS 3X35 (CAST SUPPLIES) ×2 IMPLANT
STAPLER VISISTAT 35W (STAPLE) ×2 IMPLANT
STEM IMPLANT W SCREW (Stem) ×2 IMPLANT
STOCKINETTE 6  STRL (DRAPES) ×2
STOCKINETTE 6 STRL (DRAPES) IMPLANT
SUCTION FRAZIER HANDLE 10FR (MISCELLANEOUS)
SUCTION TUBE FRAZIER 10FR DISP (MISCELLANEOUS) IMPLANT
SUT PROLENE 3 0 PS 2 (SUTURE) IMPLANT
SUT PROLENE 4 0 PS 2 18 (SUTURE) IMPLANT
SUT VIC AB 0 CT1 27 (SUTURE) ×12
SUT VIC AB 0 CT1 27XBRD ANBCTR (SUTURE) IMPLANT
SUT VIC AB 2-0 CT1 27 (SUTURE) ×12
SUT VIC AB 2-0 CT1 TAPERPNT 27 (SUTURE) IMPLANT
SUT VICRYL 4-0 PS2 18IN ABS (SUTURE) IMPLANT
SYR CONTROL 10ML LL (SYRINGE) ×2 IMPLANT
TOWEL OR 17X24 6PK STRL BLUE (TOWEL DISPOSABLE) ×4 IMPLANT
TOWEL OR 17X26 10 PK STRL BLUE (TOWEL DISPOSABLE) ×8 IMPLANT
TUBE CONNECTING 12'X1/4 (SUCTIONS) ×2
TUBE CONNECTING 12X1/4 (SUCTIONS) ×2 IMPLANT
UNDERPAD 30X30 (UNDERPADS AND DIAPERS) ×4 IMPLANT
WASHER 3.5MM (Orthopedic Implant) ×6 IMPLANT
WATER STERILE IRR 1000ML POUR (IV SOLUTION) ×4 IMPLANT
YANKAUER SUCT BULB TIP NO VENT (SUCTIONS) ×2 IMPLANT

## 2018-12-28 NOTE — Transfer of Care (Signed)
Immediate Anesthesia Transfer of Care Note  Patient: Grant Sandoval  Procedure(s) Performed: OPEN REDUCTION INTERNAL FIXATION (ORIF) ELBOW/OLECRANON FRACTURE (Left Elbow) Foreign Body Removal Adult (Left Toe)  Patient Location: PACU  Anesthesia Type:GA combined with regional for post-op pain  Level of Consciousness: drowsy  Airway & Oxygen Therapy: Patient Spontanous Breathing and Patient connected to nasal cannula oxygen  Post-op Assessment: Report given to RN and Post -op Vital signs reviewed and stable  Post vital signs: Reviewed and stable  Last Vitals:  Vitals Value Taken Time  BP    Temp    Pulse    Resp    SpO2      Last Pain:  Vitals:   12/28/18 0906  TempSrc:   PainSc: Asleep      Patients Stated Pain Goal: 3 (12/28/18 0828)  Complications: No apparent anesthesia complications

## 2018-12-28 NOTE — Anesthesia Postprocedure Evaluation (Signed)
Anesthesia Post Note  Patient: Grant Sandoval  Procedure(s) Performed: OPEN REDUCTION INTERNAL FIXATION (ORIF) ELBOW/OLECRANON FRACTURE (Left Elbow) Foreign Body Removal Adult (Left Toe)     Patient location during evaluation: PACU Anesthesia Type: General Level of consciousness: awake and alert Pain management: pain level controlled Vital Signs Assessment: post-procedure vital signs reviewed and stable Respiratory status: spontaneous breathing, nonlabored ventilation, respiratory function stable and patient connected to nasal cannula oxygen Cardiovascular status: blood pressure returned to baseline and stable Postop Assessment: no apparent nausea or vomiting Anesthetic complications: no    Last Vitals:  Vitals:   12/28/18 1240 12/28/18 1245  BP: (!) 124/56 (!) 108/36  Pulse: (!) 114 (!) 119  Resp: (!) 22 (!) 23  Temp:    SpO2: 100% 100%    Last Pain:  Vitals:   12/28/18 0906  TempSrc:   PainSc: Asleep                 Zhania Shaheen DAVID
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
## 2018-12-28 NOTE — Anesthesia Preprocedure Evaluation (Addendum)
Anesthesia Evaluation  Patient identified by MRN, date of birth, ID band Patient awake    Reviewed: Allergy & Precautions, NPO status , Patient's Chart, lab work & pertinent test results  History of Anesthesia Complications Negative for: history of anesthetic complications  Airway Mallampati: II  TM Distance: >3 FB Neck ROM: Full    Dental no notable dental hx. (+) Dental Advisory Given, Partial Upper,    Pulmonary neg pulmonary ROS,    Pulmonary exam normal        Cardiovascular negative cardio ROS Normal cardiovascular exam     Neuro/Psych negative neurological ROS  negative psych ROS   GI/Hepatic negative GI ROS, Neg liver ROS,   Endo/Other  negative endocrine ROS  Renal/GU negative Renal ROS  negative genitourinary   Musculoskeletal negative musculoskeletal ROS (+)   Abdominal   Peds negative pediatric ROS (+)  Hematology negative hematology ROS (+)   Anesthesia Other Findings   Reproductive/Obstetrics negative OB ROS                            Anesthesia Physical Anesthesia Plan  ASA: II  Anesthesia Plan: General   Post-op Pain Management:  Regional for Post-op pain   Induction: Intravenous  PONV Risk Score and Plan: 3 and Ondansetron, Dexamethasone and Diphenhydramine  Airway Management Planned: LMA  Additional Equipment:   Intra-op Plan:   Post-operative Plan: Extubation in OR  Informed Consent: I have reviewed the patients History and Physical, chart, labs and discussed the procedure including the risks, benefits and alternatives for the proposed anesthesia with the patient or authorized representative who has indicated his/her understanding and acceptance.     Dental advisory given  Plan Discussed with: CRNA and Anesthesiologist  Anesthesia Plan Comments:         Anesthesia Quick Evaluation

## 2018-12-28 NOTE — Anesthesia Procedure Notes (Signed)
Procedure Name: LMA Insertion Date/Time: 12/28/2018 1:16 PM Performed by: Quentin Ore, CRNA Pre-anesthesia Checklist: Patient identified, Emergency Drugs available, Suction available and Patient being monitored Patient Re-evaluated:Patient Re-evaluated prior to induction Oxygen Delivery Method: Circle system utilized Preoxygenation: Pre-oxygenation with 100% oxygen Induction Type: IV induction LMA: LMA inserted LMA Size: 4.0 Number of attempts: 1 Placement Confirmation: positive ETCO2 and breath sounds checked- equal and bilateral Tube secured with: Tape Dental Injury: Teeth and Oropharynx as per pre-operative assessment

## 2018-12-28 NOTE — Anesthesia Procedure Notes (Signed)
Anesthesia Regional Block: Supraclavicular block   Pre-Anesthetic Checklist: ,, timeout performed, Correct Patient, Correct Site, Correct Laterality, Correct Procedure, Correct Position, site marked, Risks and benefits discussed,  Surgical consent,  Pre-op evaluation,  At surgeon's request and post-op pain management  Laterality: Left  Prep: chloraprep       Needles:  Injection technique: Single-shot  Needle Type: Echogenic Stimulator Needle     Needle Length: 5cm  Needle Gauge: 22     Additional Needles:   Narrative:  Start time: 12/28/2018 12:31 PM End time: 12/28/2018 12:41 PM Injection made incrementally with aspirations every 5 mL.  Performed by: Personally  Anesthesiologist: Heather Roberts, MD  Additional Notes: Functioning IV was confirmed and monitors applied.  A 7mm 22ga echogenic arrow stimulator was used. Sterile prep and drape,hand hygiene and sterile gloves were used.Ultrasound guidance: relevant anatomy identified, needle position confirmed, local anesthetic spread visualized around nerve(s)., vascular puncture avoided.  Image printed for medical record.  Negative aspiration and negative test dose prior to incremental administration of local anesthetic. The patient tolerated the procedure well.

## 2018-12-29 ENCOUNTER — Encounter (HOSPITAL_COMMUNITY): Payer: Self-pay | Admitting: Orthopedic Surgery

## 2018-12-29 DIAGNOSIS — S52042B Displaced fracture of coronoid process of left ulna, initial encounter for open fracture type I or II: Secondary | ICD-10-CM | POA: Diagnosis not present

## 2018-12-29 MED ORDER — MUPIROCIN 2 % EX OINT
TOPICAL_OINTMENT | CUTANEOUS | Status: AC
  Administered 2018-12-29: 11:00:00
  Filled 2018-12-29: qty 22
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
## 2018-12-29 NOTE — Discharge Instructions (Signed)
KEEP BANDAGE CLEAN AND DRY CALL OFFICE FOR F/U APPT [phone] KEEP HAND ELEVATED ABOVE HEART OK TO APPLY ICE TO OPERATIVE AREA CONTACT OFFICE IF ANY WORSENING PAIN OR CONCERNS.
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
## 2018-12-29 NOTE — Progress Notes (Signed)
Dr Glenna Durand office (orthopedic surgery) message left to return call.  The patient does not have prn pain medication for discharge.  Awaiting phone call.

## 2018-12-30 ENCOUNTER — Encounter (HOSPITAL_COMMUNITY): Payer: Self-pay | Admitting: Emergency Medicine

## 2018-12-30 ENCOUNTER — Telehealth: Payer: Self-pay | Admitting: Surgery

## 2018-12-31 NOTE — Telephone Encounter (Signed)
PT called for wheelchair.  EDCM will leave at front desk for pickup.

## 2019-01-11 ENCOUNTER — Other Ambulatory Visit: Payer: Self-pay

## 2019-01-11 ENCOUNTER — Encounter (HOSPITAL_BASED_OUTPATIENT_CLINIC_OR_DEPARTMENT_OTHER): Payer: Managed Care, Other (non HMO) | Attending: Internal Medicine

## 2019-01-11 DIAGNOSIS — L97112 Non-pressure chronic ulcer of right thigh with fat layer exposed: Secondary | ICD-10-CM | POA: Diagnosis not present

## 2019-01-11 DIAGNOSIS — L98492 Non-pressure chronic ulcer of skin of other sites with fat layer exposed: Secondary | ICD-10-CM | POA: Diagnosis not present

## 2019-01-11 DIAGNOSIS — L97122 Non-pressure chronic ulcer of left thigh with fat layer exposed: Secondary | ICD-10-CM | POA: Diagnosis not present

## 2019-01-11 DIAGNOSIS — L98412 Non-pressure chronic ulcer of buttock with fat layer exposed: Secondary | ICD-10-CM | POA: Insufficient documentation

## 2019-01-11 DIAGNOSIS — F17200 Nicotine dependence, unspecified, uncomplicated: Secondary | ICD-10-CM | POA: Insufficient documentation

## 2019-01-11 DIAGNOSIS — L97522 Non-pressure chronic ulcer of other part of left foot with fat layer exposed: Secondary | ICD-10-CM | POA: Diagnosis present

## 2019-01-18 ENCOUNTER — Other Ambulatory Visit: Payer: Self-pay

## 2019-01-18 ENCOUNTER — Encounter (HOSPITAL_BASED_OUTPATIENT_CLINIC_OR_DEPARTMENT_OTHER): Payer: Managed Care, Other (non HMO) | Attending: Internal Medicine

## 2019-01-18 DIAGNOSIS — L97112 Non-pressure chronic ulcer of right thigh with fat layer exposed: Secondary | ICD-10-CM | POA: Diagnosis not present

## 2019-01-18 DIAGNOSIS — L97812 Non-pressure chronic ulcer of other part of right lower leg with fat layer exposed: Secondary | ICD-10-CM | POA: Diagnosis present

## 2019-01-18 DIAGNOSIS — L97122 Non-pressure chronic ulcer of left thigh with fat layer exposed: Secondary | ICD-10-CM | POA: Diagnosis not present

## 2019-01-18 DIAGNOSIS — L98492 Non-pressure chronic ulcer of skin of other sites with fat layer exposed: Secondary | ICD-10-CM | POA: Diagnosis not present

## 2019-01-28 ENCOUNTER — Other Ambulatory Visit: Payer: Self-pay

## 2019-01-28 ENCOUNTER — Ambulatory Visit: Payer: Managed Care, Other (non HMO) | Attending: Physician Assistant | Admitting: Occupational Therapy

## 2019-01-28 DIAGNOSIS — M25522 Pain in left elbow: Secondary | ICD-10-CM | POA: Diagnosis present

## 2019-01-28 DIAGNOSIS — R600 Localized edema: Secondary | ICD-10-CM | POA: Diagnosis present

## 2019-01-28 DIAGNOSIS — R278 Other lack of coordination: Secondary | ICD-10-CM | POA: Diagnosis present

## 2019-01-28 DIAGNOSIS — M25622 Stiffness of left elbow, not elsewhere classified: Secondary | ICD-10-CM

## 2019-01-28 DIAGNOSIS — M6281 Muscle weakness (generalized): Secondary | ICD-10-CM | POA: Insufficient documentation

## 2019-01-28 DIAGNOSIS — M25632 Stiffness of left wrist, not elsewhere classified: Secondary | ICD-10-CM
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
## 2019-01-28 NOTE — Patient Instructions (Addendum)
  AROM: Elbow Flexion / Extension    Gently bend elbow as far as possible. Then straighten arm as far as possible. Repeat _10-15___ times EACH WAY. Do _6___ sessions per day.    Elbow / Wrist Supination / Pronation   Bend LT elbow(s) to 90 and hold close to body. Turn palm(s) up. Then turn palm(s) down. Keep wrist straight. Repeat sequence _10-15___ times EACH WAY. Do _6___ sessions per day.   AROM: Wrist Extension   .  With _LT___ palm down, bend wrist up, then down. Repeat __10-15__ times EACH WAY.  Do __6__ sessions per day.    Elbow: Flexion   Use other hand to bend elbow with thumb toward the same shoulder. Do NOT force this motion. Hold _10-20___ seconds. Repeat _5___ times. Do _6___ sessions per day. CAUTION: Movement should be gentle, steady and slow.  Elbow: Extension   Place thick telephone book on table and rest upper arm on it. Grasp forearm with other hand and use a steady downward and outward pull to straighten elbow. Hold __10-20__ seconds. Repeat __5__ times. Do _6___ sessions per day. CAUTION: Stretch slowly and gently. Do not force joint.  Supination (Passive)   Keep elbow bent at right angle and held firmly at side. Use other hand to turn forearm until palm faces upward. Hold __10-20__ seconds. Repeat _5___ times. Do _6___ sessions per day.   Pronation (Passive)   Keep elbow bent at right angle and held firmly to side. Use other hand to turn forearm until palm faces downward. Hold __10-20__ seconds. Repeat __5__ times. Do __6__ sessions per day.   PROM: Wrist Flexion / Extension   Grasp  hand and slowly bend wrist until stretch is felt. Relax. Then stretch as far as possible in opposite direction. Be sure to keep elbow bent.  Hold __10__ sec. each way Repeat _5___ times per set.    Do _4-6___ sessions per day.  ALSO, REMEMBER TO DO WALL SLIDES FOR SHOULDER, AND KEEP FINGERS MOVING!!       SCAR MASSAGE:   Use cocoa butter or  Vitamin E cream and apply deep pressure in circular motion along incision for approx 5 minutes, 2x/day. This will help w/ scarring, breaking up scar tissue, and desensitization  WEAR COMPRESSION STOCKINETTE TO HELP W/ SWELLING AS NEEDED. REMOVE IMMEDIATELY IF INCREASED NUMBNESS OR CONCERNS WITH CIRCULATION

## 2019-02-02 DIAGNOSIS — L97812 Non-pressure chronic ulcer of other part of right lower leg with fat layer exposed: Secondary | ICD-10-CM | POA: Diagnosis not present

## 2019-02-04 ENCOUNTER — Ambulatory Visit: Payer: Managed Care, Other (non HMO) | Admitting: Occupational Therapy

## 2019-02-04 ENCOUNTER — Other Ambulatory Visit: Payer: Self-pay

## 2019-02-04 DIAGNOSIS — M6281 Muscle weakness (generalized): Secondary | ICD-10-CM

## 2019-02-04 DIAGNOSIS — M25632 Stiffness of left wrist, not elsewhere classified: Secondary | ICD-10-CM

## 2019-02-04 DIAGNOSIS — M25522 Pain in left elbow: Secondary | ICD-10-CM

## 2019-02-04 DIAGNOSIS — M25622 Stiffness of left elbow, not elsewhere classified: Secondary | ICD-10-CM
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
## 2019-02-04 NOTE — Patient Instructions (Signed)
  Flexion (Resistive)    Hold a can weighing __2__ LBS in hand and bend elbow, keeping wrist straight. Support elbow with folded towel on table, or hold close to body. Hold _5___ seconds. Repeat __10__ times. Do __2-3__ sessions per day.  Leisurely Elbow Weight    Lie comfortably on back with a 2 LB weight in one Lt palm. Bend arm so elbow points up and weight gently hangs. Keep shoulder flat on floor. Raise hand to straighten arm. Repeat with other arm. Repeat __10__ times. Do __2-3__ sessions per day.  Then do above positions w/ 3 lb. Weight and hold position for 10 sec. X 5 for a weighted stretch. You are not going through the movement but just hold the weight with elbow as straight as possible x 10 sec. 5 reps. Then do in  bent position as far as possible.   Pronation / Supination (Resistive)    Hold hammer at upper 1/3 rotate palm up and down. Keep elbow flexed at side and wrist straight. Repeat _10___ times. Do _2-3___ sessions per day. Then hold hammer a little further down and hold position palm UP for a weighted stretch x 10 sec. X 5 reps. Then with palm DOWN.    Extension (Resistive)    With wrist over edge of table, lift __2__ lbs, keeping arm on table surface. Hold _5___ seconds. Lower slowly. Repeat __10__ times. Do __2-3__ sessions per day.  1. Grip Strengthening (Resistive Putty)   Squeeze putty using thumb and all fingers. Repeat _20___ times. Do __2-3__ sessions per day.   2. Roll putty into tube on table and pinch between each finger and thumb x 10 reps each. (Do ring and small finger together)

## 2019-02-10 ENCOUNTER — Ambulatory Visit: Payer: Managed Care, Other (non HMO) | Admitting: Occupational Therapy

## 2019-02-10 ENCOUNTER — Other Ambulatory Visit: Payer: Self-pay

## 2019-02-10 DIAGNOSIS — M25622 Stiffness of left elbow, not elsewhere classified: Secondary | ICD-10-CM

## 2019-02-10 DIAGNOSIS — M25632 Stiffness of left wrist, not elsewhere classified: Secondary | ICD-10-CM

## 2019-02-10 DIAGNOSIS — M25522 Pain in left elbow: Secondary | ICD-10-CM

## 2019-02-10 DIAGNOSIS — M6281 Muscle weakness (generalized): Secondary | ICD-10-CM
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
## 2019-02-11 ENCOUNTER — Ambulatory Visit: Payer: Managed Care, Other (non HMO) | Admitting: Occupational Therapy

## 2019-02-18 ENCOUNTER — Other Ambulatory Visit: Payer: Self-pay

## 2019-02-18 ENCOUNTER — Ambulatory Visit: Payer: Managed Care, Other (non HMO) | Attending: Physician Assistant | Admitting: Occupational Therapy

## 2019-02-18 DIAGNOSIS — M25522 Pain in left elbow: Secondary | ICD-10-CM | POA: Diagnosis present

## 2019-02-18 DIAGNOSIS — M25632 Stiffness of left wrist, not elsewhere classified: Secondary | ICD-10-CM

## 2019-02-18 DIAGNOSIS — M6281 Muscle weakness (generalized): Secondary | ICD-10-CM

## 2019-02-18 DIAGNOSIS — M25622 Stiffness of left elbow, not elsewhere classified: Secondary | ICD-10-CM | POA: Diagnosis present
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
## 2019-02-25 ENCOUNTER — Ambulatory Visit: Payer: Managed Care, Other (non HMO) | Admitting: Occupational Therapy

## 2019-02-25 ENCOUNTER — Other Ambulatory Visit: Payer: Self-pay

## 2019-02-25 DIAGNOSIS — M25622 Stiffness of left elbow, not elsewhere classified: Secondary | ICD-10-CM

## 2019-02-25 DIAGNOSIS — M25632 Stiffness of left wrist, not elsewhere classified: Secondary | ICD-10-CM

## 2019-02-25 DIAGNOSIS — M6281 Muscle weakness (generalized): Secondary | ICD-10-CM

## 2019-02-25 DIAGNOSIS — M25522 Pain in left elbow: Secondary | ICD-10-CM
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
## 2019-03-02 ENCOUNTER — Ambulatory Visit: Payer: Managed Care, Other (non HMO) | Admitting: Occupational Therapy

## 2019-03-04 ENCOUNTER — Ambulatory Visit: Payer: Managed Care, Other (non HMO) | Admitting: Occupational Therapy

## 2019-03-11 ENCOUNTER — Other Ambulatory Visit: Payer: Self-pay

## 2019-03-11 ENCOUNTER — Ambulatory Visit: Payer: Managed Care, Other (non HMO) | Admitting: Occupational Therapy

## 2019-03-11 DIAGNOSIS — M25522 Pain in left elbow: Secondary | ICD-10-CM

## 2019-03-11 DIAGNOSIS — M6281 Muscle weakness (generalized): Secondary | ICD-10-CM

## 2019-03-11 DIAGNOSIS — M25622 Stiffness of left elbow, not elsewhere classified: Secondary | ICD-10-CM | POA: Diagnosis not present

## 2019-03-11 DIAGNOSIS — M25632 Stiffness of left wrist, not elsewhere classified: Secondary | ICD-10-CM
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
## 2019-03-16 ENCOUNTER — Ambulatory Visit: Payer: Managed Care, Other (non HMO) | Attending: Physician Assistant | Admitting: Occupational Therapy

## 2019-03-16 ENCOUNTER — Other Ambulatory Visit: Payer: Self-pay

## 2019-03-16 DIAGNOSIS — M25622 Stiffness of left elbow, not elsewhere classified: Secondary | ICD-10-CM | POA: Insufficient documentation

## 2019-03-16 DIAGNOSIS — M6281 Muscle weakness (generalized): Secondary | ICD-10-CM | POA: Diagnosis present

## 2019-03-16 DIAGNOSIS — M25632 Stiffness of left wrist, not elsewhere classified: Secondary | ICD-10-CM | POA: Diagnosis present

## 2019-03-16 DIAGNOSIS — M25522 Pain in left elbow: Secondary | ICD-10-CM | POA: Diagnosis present
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
## 2019-03-18 ENCOUNTER — Ambulatory Visit: Payer: Managed Care, Other (non HMO) | Admitting: Occupational Therapy

## 2019-03-23 ENCOUNTER — Ambulatory Visit: Payer: Managed Care, Other (non HMO) | Admitting: Occupational Therapy

## 2019-03-24 ENCOUNTER — Ambulatory Visit: Payer: Managed Care, Other (non HMO) | Admitting: Occupational Therapy

## 2019-03-25 ENCOUNTER — Ambulatory Visit: Payer: Managed Care, Other (non HMO) | Admitting: Occupational Therapy

## 2019-03-30 ENCOUNTER — Ambulatory Visit: Payer: Managed Care, Other (non HMO) | Admitting: Occupational Therapy

## 2019-04-01 ENCOUNTER — Ambulatory Visit: Payer: Managed Care, Other (non HMO) | Admitting: Occupational Therapy

## 2019-04-01 ENCOUNTER — Telehealth: Payer: Self-pay

## 2019-04-01 NOTE — Telephone Encounter (Signed)
Called and left message re: missed O.T.  appointment today, and reminder of next appointment date/time.

## 2019-04-01 NOTE — Telephone Encounter (Signed)
Created in error -duplicate

## 2019-04-06 ENCOUNTER — Ambulatory Visit: Payer: Managed Care, Other (non HMO) | Admitting: Occupational Therapy

## 2019-04-06 ENCOUNTER — Other Ambulatory Visit: Payer: Self-pay

## 2019-04-06 DIAGNOSIS — M25622 Stiffness of left elbow, not elsewhere classified: Secondary | ICD-10-CM

## 2019-04-06 DIAGNOSIS — M25522 Pain in left elbow: Secondary | ICD-10-CM

## 2019-04-06 DIAGNOSIS — M6281 Muscle weakness (generalized): Secondary | ICD-10-CM

## 2019-04-06 DIAGNOSIS — M25632 Stiffness of left wrist, not elsewhere classified: Secondary | ICD-10-CM
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
## 2019-04-08 ENCOUNTER — Ambulatory Visit: Payer: Managed Care, Other (non HMO) | Admitting: Occupational Therapy

## 2019-04-13 ENCOUNTER — Telehealth: Payer: Self-pay

## 2019-04-13 ENCOUNTER — Ambulatory Visit: Payer: Managed Care, Other (non HMO) | Admitting: Occupational Therapy

## 2019-04-13 NOTE — Telephone Encounter (Signed)
Called and left message re: missed O.T. appointment today and reminded patient of next scheduled appointment, and requested he call back to cancel if he cannot make appointment.

## 2019-04-15 ENCOUNTER — Ambulatory Visit: Payer: Managed Care, Other (non HMO) | Attending: Physician Assistant | Admitting: Occupational Therapy

## 2019-04-21 ENCOUNTER — Ambulatory Visit: Payer: Managed Care, Other (non HMO) | Admitting: Occupational Therapy

## 2019-04-22 ENCOUNTER — Ambulatory Visit: Payer: Managed Care, Other (non HMO) | Admitting: Occupational Therapy

## 2019-08-10 NOTE — Therapy (Signed)
[facility] [address] [place], [place], [address] Phone: [phone]   Fax:  [phone]  Patient Details  Name: Grant Sandoval MRN: [id number] Date of Birth: 06-12-1991 Referring Provider:  No ref. provider found  Encounter Date: 08/10/2019   OCCUPATIONAL THERAPY DISCHARGE SUMMARY  Visits from Start of Care: 8  Current functional level related to goals / functional outcomes: OT Short Term Goals - 04/06/19 1137      OT SHORT TERM GOAL #1   Title  Independent with initial HEP - 02/27/19    Baseline  issued, may need review/modifications    Time  4    Period  Weeks    Status  Achieved      OT SHORT TERM GOAL #2   Title  Pt to incorporate Lt dominant UE into BADLS 51% of the time     Baseline  dependent     Time  4    Period  Weeks    Status  Achieved   also eating 50% with Lt hand, and now reports wiping for perineal care w/ Lt hand     OT SHORT TERM GOAL #3   Title  Pt to write name and sentence Lt hand at 80% or greater legibility    Baseline  unable    Time  4    Period  Weeks    Status  Achieved   95% legibility     OT SHORT TERM GOAL #4   Title  Lt elbow flexion and extension to increase by 10 degrees or more in prep for increased ADL function    Baseline  elbow flex = 78*, ext = -50*     Time  4    Period  Weeks    Status  Achieved   elbow flex = 104*, ext = -40*     OT SHORT TERM GOAL #5   Title  Lt forearm supination/pronation to improve by 10* or greater in prep for increased ADL function    Baseline  sup = 35*, pron = 20*     Time  4    Period  Weeks    Status  Achieved   sup/pron = 60*     OT SHORT TERM GOAL #6   Title  Pt to improve grip strength to 30 lbs or greater Lt hand for opening jars/containers    Baseline  18 lbs    Time  4    Period  Weeks    Status  Achieved   59 lbs, 04/06/19 = 71 lbs     OT Long Term Goals - 04/06/19 1141      OT LONG TERM GOAL #1   Title   Independent with updated HEP - 04/29/19    Baseline  DEPENDENT    Time  8    Period  Weeks    Status  Achieved      OT LONG TERM GOAL #2   Title  Pt to incorporate Lt dominant UE into ADLS 70% of the time    Baseline  dependent     Time  8    Period  Weeks    Status  UNKNOWN      OT LONG TERM GOAL #3   Title  Pain less than or equal to 5/10 with light functional activities     Baseline  7/10 even at rest    Time  8    Status  Achieved  OT LONG TERM GOAL #4   Title  Improve Lt elbow flexion and extension by 20* or greater for greater independence with ADLS    Baseline  flex = 78*, ext = -50*    Time  8    Period  Weeks    Status  UNKNOWN      OT LONG TERM GOAL #5   Title  Improve Lt elbow supination and pronation by 20* or greater for greater independence with ADLS    Baseline  sup = 35*, pron = 20*    Time  8    Period  Weeks    Status  Achieved   sup/pron = 60*     OT LONG TERM GOAL #6   Title  Improve grip strength Lt hand to 45 lbs or greater for opening tight jars    Baseline  18 lbs    Time  8    Period  Weeks    Status  Achieved   71 lbs        Remaining deficits: unknown as pt did not return   Education / Equipment: HEP's, splint wear and care  Plan: Patient agrees to discharge.  Patient goals were partially met. Patient is being discharged due to not returning since the last visit.  ?????       Carey Bullocks, OTR/L 08/10/2019, 9:29 AM  [facility] [address] [place], [place], [address] Phone: [phone]   Fax:  [phone]

## 2020-05-05 ENCOUNTER — Other Ambulatory Visit: Payer: Self-pay

## 2020-05-05 ENCOUNTER — Encounter (HOSPITAL_COMMUNITY): Payer: Self-pay | Admitting: Emergency Medicine

## 2020-05-05 ENCOUNTER — Ambulatory Visit (HOSPITAL_COMMUNITY)
Admission: EM | Admit: 2020-05-05 | Discharge: 2020-05-05 | Disposition: A | Discharge status: Home / Self Care | Payer: 59 | Attending: Family Medicine | Admitting: Family Medicine

## 2020-05-05 ENCOUNTER — Ambulatory Visit (INDEPENDENT_AMBULATORY_CARE_PROVIDER_SITE_OTHER): Payer: 59

## 2020-05-05 DIAGNOSIS — M79602 Pain in left arm: Secondary | ICD-10-CM | POA: Diagnosis not present

## 2020-05-05 DIAGNOSIS — M79632 Pain in left forearm: Secondary | ICD-10-CM

## 2020-05-05 MED ORDER — NAPROXEN 500 MG PO TABS: 500.0000 mg | ORAL_TABLET | Freq: Two times a day (BID) | ORAL | 0 refills | Status: AC

## 2020-05-05 MED ORDER — CYCLOBENZAPRINE HCL 10 MG PO TABS: 5.0000 mg | ORAL_TABLET | Freq: Three times a day (TID) | ORAL | 0 refills | Status: AC | PRN
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
## 2020-08-12 ENCOUNTER — Emergency Department (HOSPITAL_COMMUNITY): Payer: Self-pay

## 2020-08-12 ENCOUNTER — Other Ambulatory Visit: Payer: Self-pay

## 2020-08-12 ENCOUNTER — Encounter (HOSPITAL_COMMUNITY): Payer: Self-pay

## 2020-08-12 ENCOUNTER — Emergency Department (HOSPITAL_COMMUNITY)
Admission: EM | Admit: 2020-08-12 | Discharge: 2020-08-12 | Payer: Self-pay | Attending: Emergency Medicine | Admitting: Emergency Medicine

## 2020-08-12 DIAGNOSIS — F17200 Nicotine dependence, unspecified, uncomplicated: Secondary | ICD-10-CM | POA: Insufficient documentation

## 2020-08-12 DIAGNOSIS — W3400XA Accidental discharge from unspecified firearms or gun, initial encounter: Secondary | ICD-10-CM | POA: Insufficient documentation

## 2020-08-12 DIAGNOSIS — S71101A Unspecified open wound, right thigh, initial encounter: Secondary | ICD-10-CM | POA: Insufficient documentation

## 2020-08-12 DIAGNOSIS — Y9301 Activity, walking, marching and hiking: Secondary | ICD-10-CM | POA: Insufficient documentation

## 2020-08-12 LAB — I-STAT CHEM 8, ED
BUN: 17 mg/dL (ref 6–20)
Calcium, Ion: 1.19 mmol/L (ref 1.15–1.40)
Chloride: 105 mmol/L (ref 98–111)
Creatinine, Ser: 1.2 mg/dL (ref 0.61–1.24)
Glucose, Bld: 122 mg/dL — ABNORMAL HIGH (ref 70–99)
HCT: 41 % (ref 39.0–52.0)
Hemoglobin: 13.9 g/dL (ref 13.0–17.0)
Potassium: 2.9 mmol/L — ABNORMAL LOW (ref 3.5–5.1)
Sodium: 145 mmol/L (ref 135–145)
TCO2: 18 mmol/L — ABNORMAL LOW (ref 22–32)

## 2020-08-12 LAB — BASIC METABOLIC PANEL
Anion gap: 9 (ref 5–15)
BUN: 13 mg/dL (ref 6–20)
CO2: 24 mmol/L (ref 22–32)
Calcium: 9.2 mg/dL (ref 8.9–10.3)
Chloride: 107 mmol/L (ref 98–111)
Creatinine, Ser: 1.12 mg/dL (ref 0.61–1.24)
GFR, Estimated: >60 mL/min (ref >60)
Glucose, Bld: 108 mg/dL — ABNORMAL HIGH (ref 70–99)
Potassium: 4.1 mmol/L (ref 3.5–5.1)
Sodium: 140 mmol/L (ref 135–145)

## 2020-08-12 LAB — CBC
HCT: 42.9 % (ref 39.0–52.0)
Hemoglobin: 14.2 g/dL (ref 13.0–17.0)
MCH: 30.5 pg (ref 26.0–34.0)
MCHC: 33.1 g/dL (ref 30.0–36.0)
MCV: 92.3 fL (ref 80.0–100.0)
Platelets: 331 10*3/uL (ref 150–400)
RBC: 4.65 MIL/uL (ref 4.22–5.81)
RDW: 12 % (ref 11.5–15.5)
WBC: 14 10*3/uL — ABNORMAL HIGH (ref 4.0–10.5)
nRBC: 0 % (ref 0.0–0.2)

## 2020-08-12 LAB — URINALYSIS, ROUTINE W REFLEX MICROSCOPIC
Bilirubin Urine: NEGATIVE
Glucose, UA: NEGATIVE mg/dL
Hgb urine dipstick: NEGATIVE
Ketones, ur: NEGATIVE mg/dL
Leukocytes,Ua: NEGATIVE
Nitrite: NEGATIVE
Protein, ur: 30 mg/dL — AB
Specific Gravity, Urine: 1.018 (ref 1.005–1.030)
pH: 5 (ref 5.0–8.0)

## 2020-08-12 LAB — COMPREHENSIVE METABOLIC PANEL
ALT: 15 U/L (ref 0–44)
AST: 26 U/L (ref 15–41)
Albumin: 4.6 g/dL (ref 3.5–5.0)
Alkaline Phosphatase: 57 U/L (ref 38–126)
Anion gap: 18 — ABNORMAL HIGH (ref 5–15)
BUN: 15 mg/dL (ref 6–20)
CO2: 18 mmol/L — ABNORMAL LOW (ref 22–32)
Calcium: 9.6 mg/dL (ref 8.9–10.3)
Chloride: 107 mmol/L (ref 98–111)
Creatinine, Ser: 1.36 mg/dL — ABNORMAL HIGH (ref 0.61–1.24)
GFR, Estimated: >60 mL/min (ref >60)
Glucose, Bld: 126 mg/dL — ABNORMAL HIGH (ref 70–99)
Potassium: 3.1 mmol/L — ABNORMAL LOW (ref 3.5–5.1)
Sodium: 143 mmol/L (ref 135–145)
Total Bilirubin: 1.1 mg/dL (ref 0.3–1.2)
Total Protein: 7.4 g/dL (ref 6.5–8.1)

## 2020-08-12 LAB — PROTIME-INR
INR: 1.1 (ref 0.8–1.2)
Prothrombin Time: 13.4 seconds (ref 11.4–15.2)

## 2020-08-12 LAB — SAMPLE TO BLOOD BANK

## 2020-08-12 LAB — ETHANOL: Alcohol, Ethyl (B): <10 mg/dL (ref <10)

## 2020-08-12 LAB — LACTIC ACID, PLASMA
Lactic Acid, Venous: 10.8 mmol/L (ref 0.5–1.9)
Lactic Acid, Venous: 2.3 mmol/L (ref 0.5–1.9)

## 2020-08-12 MED ORDER — OXYCODONE-ACETAMINOPHEN 5-325 MG PO TABS
2.0000 | ORAL_TABLET | Freq: Once | ORAL | Status: AC
  Administered 2020-08-12: 2 via ORAL
  Filled 2020-08-12: qty 2

## 2020-08-12 MED ORDER — OXYCODONE-ACETAMINOPHEN 5-325 MG PO TABS
2.0000 | ORAL_TABLET | ORAL | 0 refills | Status: AC | PRN
Start: 2020-08-12 — End: ?

## 2020-08-12 MED ORDER — LACTATED RINGERS IV BOLUS
2000.0000 mL | Freq: Once | INTRAVENOUS | Status: AC
  Administered 2020-08-12: 2000 mL via INTRAVENOUS

## 2020-08-12 MED ORDER — POTASSIUM CHLORIDE CRYS ER 20 MEQ PO TBCR
80.0000 meq | EXTENDED_RELEASE_TABLET | Freq: Once | ORAL | Status: AC
  Administered 2020-08-12: 80 meq via ORAL
  Filled 2020-08-12: qty 4

## 2020-08-12 MED ORDER — FENTANYL CITRATE (PF) 100 MCG/2ML IJ SOLN
100.0000 ug | Freq: Once | INTRAMUSCULAR | Status: AC
  Administered 2020-08-12: 100 ug via INTRAVENOUS
  Filled 2020-08-12: qty 2

## 2020-08-12 MED ORDER — POTASSIUM CHLORIDE 10 MEQ/100ML IV SOLN
10.0000 meq | Freq: Once | INTRAVENOUS | Status: AC
  Administered 2020-08-12: 10 meq via INTRAVENOUS
  Filled 2020-08-12: qty 100
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
## 2020-08-12 NOTE — Consult Note (Signed)
Responded to page, pt unavailable, no family present, pls call again if further chaplain services desired.  Rev. Harlan Ervine Chaplain 
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
## 2020-08-12 NOTE — ED Notes (Signed)
Pt wounds dressed and clean per provider order.

## 2020-08-14 ENCOUNTER — Encounter (HOSPITAL_COMMUNITY): Payer: Self-pay | Admitting: Emergency Medicine
